# Patient Record
Sex: Male | Born: 1950 | Race: White | Hispanic: No | Marital: Married | Smoking: Never smoker
Health system: Southern US, Community
[De-identification: ages and names within clinical notes are randomized; demographics above are authoritative.]

## PROBLEM LIST (undated history)

## (undated) DIAGNOSIS — M1990 Unspecified osteoarthritis, unspecified site: Secondary | ICD-10-CM

## (undated) DIAGNOSIS — I10 Essential (primary) hypertension: Secondary | ICD-10-CM

## (undated) DIAGNOSIS — D649 Anemia, unspecified: Secondary | ICD-10-CM

## (undated) DIAGNOSIS — K219 Gastro-esophageal reflux disease without esophagitis: Secondary | ICD-10-CM

## (undated) HISTORY — DX: Anemia, unspecified: D64.9

## (undated) HISTORY — PX: OTHER SURGICAL HISTORY: SHX169

## (undated) HISTORY — DX: Gastro-esophageal reflux disease without esophagitis: K21.9

## (undated) HISTORY — PX: NO PAST SURGERIES: SHX2092

---

## 2011-12-09 ENCOUNTER — Other Ambulatory Visit: Payer: Self-pay | Admitting: Neurosurgery

## 2011-12-11 ENCOUNTER — Encounter (HOSPITAL_COMMUNITY): Payer: Self-pay | Admitting: Pharmacy Technician

## 2011-12-13 ENCOUNTER — Ambulatory Visit (HOSPITAL_COMMUNITY)
Admission: RE | Admit: 2011-12-13 | Discharge: 2011-12-13 | Disposition: A | Discharge status: Home / Self Care | Payer: Commercial Managed Care - PPO | Source: Ambulatory Visit | Attending: Neurosurgery | Admitting: Neurosurgery

## 2011-12-13 ENCOUNTER — Encounter (HOSPITAL_COMMUNITY)
Admission: RE | Admit: 2011-12-13 | Discharge: 2011-12-13 | Disposition: A | Discharge status: Home / Self Care | Payer: Commercial Managed Care - PPO | Source: Ambulatory Visit | Attending: Neurosurgery | Admitting: Neurosurgery

## 2011-12-13 ENCOUNTER — Encounter (HOSPITAL_COMMUNITY): Payer: Self-pay

## 2011-12-13 DIAGNOSIS — Z01810 Encounter for preprocedural cardiovascular examination: Secondary | ICD-10-CM | POA: Insufficient documentation

## 2011-12-13 DIAGNOSIS — Z01812 Encounter for preprocedural laboratory examination: Secondary | ICD-10-CM | POA: Insufficient documentation

## 2011-12-13 DIAGNOSIS — Z01818 Encounter for other preprocedural examination: Secondary | ICD-10-CM | POA: Insufficient documentation

## 2011-12-13 DIAGNOSIS — I10 Essential (primary) hypertension: Secondary | ICD-10-CM | POA: Insufficient documentation

## 2011-12-13 HISTORY — DX: Unspecified osteoarthritis, unspecified site: M19.90

## 2011-12-13 HISTORY — DX: Essential (primary) hypertension: I10

## 2011-12-13 LAB — SURGICAL PCR SCREEN
MRSA, PCR: NEGATIVE
Staphylococcus aureus: NEGATIVE

## 2011-12-13 LAB — BASIC METABOLIC PANEL
Chloride: 99 mEq/L (ref 96–112)
GFR calc Af Amer: >90 mL/min (ref >90)
GFR calc non Af Amer: 80 mL/min — ABNORMAL LOW (ref >90)
Potassium: 4.7 mEq/L (ref 3.5–5.1)
Sodium: 133 mEq/L — ABNORMAL LOW (ref 135–145)

## 2011-12-13 LAB — CBC
HCT: 38.4 % — ABNORMAL LOW (ref 39.0–52.0)
Hemoglobin: 13.5 g/dL (ref 13.0–17.0)
RBC: 4.5 MIL/uL (ref 4.22–5.81)
WBC: 11.7 10*3/uL — ABNORMAL HIGH (ref 4.0–10.5)

## 2011-12-17 MED ORDER — CEFAZOLIN SODIUM-DEXTROSE 2-3 GM-% IV SOLR
2.0000 g | INTRAVENOUS | Status: AC
  Administered 2011-12-18: 2 g via INTRAVENOUS

## 2011-12-18 ENCOUNTER — Inpatient Hospital Stay (HOSPITAL_COMMUNITY)
Admission: RE | Admit: 2011-12-18 | Discharge: 2011-12-19 | DRG: 473 | Disposition: A | Discharge status: Home / Self Care | Payer: Commercial Managed Care - PPO | Source: Ambulatory Visit | Attending: Neurosurgery | Admitting: Neurosurgery

## 2011-12-18 ENCOUNTER — Encounter (HOSPITAL_COMMUNITY): Payer: Self-pay | Admitting: Anesthesiology

## 2011-12-18 ENCOUNTER — Ambulatory Visit (HOSPITAL_COMMUNITY): Payer: Commercial Managed Care - PPO | Admitting: Anesthesiology

## 2011-12-18 ENCOUNTER — Ambulatory Visit (HOSPITAL_COMMUNITY): Payer: Commercial Managed Care - PPO

## 2011-12-18 ENCOUNTER — Encounter (HOSPITAL_COMMUNITY)
Admission: RE | Disposition: A | Discharge status: Home / Self Care | Payer: Self-pay | Source: Ambulatory Visit | Attending: Neurosurgery

## 2011-12-18 ENCOUNTER — Encounter (HOSPITAL_COMMUNITY): Payer: Self-pay | Admitting: *Deleted

## 2011-12-18 DIAGNOSIS — M5030 Other cervical disc degeneration, unspecified cervical region: Principal | ICD-10-CM | POA: Diagnosis present

## 2011-12-18 DIAGNOSIS — I10 Essential (primary) hypertension: Secondary | ICD-10-CM | POA: Diagnosis present

## 2011-12-18 DIAGNOSIS — M47812 Spondylosis without myelopathy or radiculopathy, cervical region: Secondary | ICD-10-CM | POA: Diagnosis present

## 2011-12-18 DIAGNOSIS — M4712 Other spondylosis with myelopathy, cervical region: Secondary | ICD-10-CM

## 2011-12-18 HISTORY — PX: ANTERIOR CERVICAL DECOMP/DISCECTOMY FUSION: SHX1161

## 2011-12-18 SURGERY — ANTERIOR CERVICAL DECOMPRESSION/DISCECTOMY FUSION 1 LEVEL
Anesthesia: General | Wound class: Clean

## 2011-12-18 MED ORDER — NEOSTIGMINE METHYLSULFATE 1 MG/ML IJ SOLN
INTRAMUSCULAR | Status: DC | PRN
  Administered 2011-12-18: 4 mg via INTRAVENOUS

## 2011-12-18 MED ORDER — MENTHOL 3 MG MT LOZG: 1.0000 | LOZENGE | OROMUCOSAL | Status: DC | PRN

## 2011-12-18 MED ORDER — OXYCODONE-ACETAMINOPHEN 5-325 MG PO TABS: 1.0000 | ORAL_TABLET | ORAL | Status: DC | PRN

## 2011-12-18 MED ORDER — ROCURONIUM BROMIDE 100 MG/10ML IV SOLN
INTRAVENOUS | Status: DC | PRN
  Administered 2011-12-18: 50 mg via INTRAVENOUS

## 2011-12-18 MED ORDER — GLYCOPYRROLATE 0.2 MG/ML IJ SOLN
INTRAMUSCULAR | Status: DC | PRN
  Administered 2011-12-18: .8 mg via INTRAVENOUS

## 2011-12-18 MED ORDER — HYDROMORPHONE HCL PF 1 MG/ML IJ SOLN: 0.2500 mg | INTRAMUSCULAR | Status: DC | PRN

## 2011-12-18 MED ORDER — FENTANYL CITRATE 0.05 MG/ML IJ SOLN
INTRAMUSCULAR | Status: DC | PRN
  Administered 2011-12-18: 100 ug via INTRAVENOUS
  Administered 2011-12-18 (×3): 50 ug via INTRAVENOUS

## 2011-12-18 MED ORDER — DEXAMETHASONE 4 MG PO TABS
4.0000 mg | ORAL_TABLET | Freq: Four times a day (QID) | ORAL | Status: AC
  Administered 2011-12-18: 4 mg via ORAL
  Filled 2011-12-18 (×3): qty 1

## 2011-12-18 MED ORDER — ACETAMINOPHEN 650 MG RE SUPP: 650.0000 mg | RECTAL | Status: DC | PRN

## 2011-12-18 MED ORDER — METOCLOPRAMIDE HCL 5 MG/ML IJ SOLN: 10.0000 mg | Freq: Once | INTRAMUSCULAR | Status: DC | PRN

## 2011-12-18 MED ORDER — OXYCODONE HCL 5 MG PO TABS: 5.0000 mg | ORAL_TABLET | Freq: Once | ORAL | Status: DC | PRN

## 2011-12-18 MED ORDER — ONDANSETRON HCL 4 MG/2ML IJ SOLN: 4.0000 mg | INTRAMUSCULAR | Status: DC | PRN

## 2011-12-18 MED ORDER — LACTATED RINGERS IV SOLN
INTRAVENOUS | Status: DC
  Administered 2011-12-18: 18:00:00 via INTRAVENOUS

## 2011-12-18 MED ORDER — DIAZEPAM 5 MG PO TABS
5.0000 mg | ORAL_TABLET | Freq: Four times a day (QID) | ORAL | Status: DC | PRN
  Administered 2011-12-18 – 2011-12-19 (×2): 5 mg via ORAL
  Filled 2011-12-18 (×2): qty 1

## 2011-12-18 MED ORDER — DEXAMETHASONE SODIUM PHOSPHATE 10 MG/ML IJ SOLN
INTRAMUSCULAR | Status: DC | PRN
  Administered 2011-12-18: 10 mg via INTRAVENOUS

## 2011-12-18 MED ORDER — HYDROCODONE-ACETAMINOPHEN 5-325 MG PO TABS
1.0000 | ORAL_TABLET | ORAL | Status: DC | PRN
  Administered 2011-12-18 – 2011-12-19 (×3): 2 via ORAL
  Filled 2011-12-18 (×3): qty 2

## 2011-12-18 MED ORDER — PROPOFOL 10 MG/ML IV BOLUS
INTRAVENOUS | Status: DC | PRN
  Administered 2011-12-18: 200 mg via INTRAVENOUS

## 2011-12-18 MED ORDER — ONDANSETRON HCL 4 MG/2ML IJ SOLN
INTRAMUSCULAR | Status: DC | PRN
  Administered 2011-12-18 (×2): 4 mg via INTRAVENOUS

## 2011-12-18 MED ORDER — THROMBIN 5000 UNITS EX SOLR
CUTANEOUS | Status: DC | PRN
  Administered 2011-12-18 (×2): 5000 [IU] via TOPICAL

## 2011-12-18 MED ORDER — ZOLPIDEM TARTRATE 5 MG PO TABS: 5.0000 mg | ORAL_TABLET | Freq: Every evening | ORAL | Status: DC | PRN

## 2011-12-18 MED ORDER — DEXAMETHASONE SODIUM PHOSPHATE 4 MG/ML IJ SOLN
4.0000 mg | Freq: Four times a day (QID) | INTRAMUSCULAR | Status: AC
  Administered 2011-12-19 (×2): 4 mg via INTRAVENOUS
  Filled 2011-12-18 (×2): qty 1

## 2011-12-18 MED ORDER — ARTIFICIAL TEARS OP OINT
TOPICAL_OINTMENT | OPHTHALMIC | Status: DC | PRN
  Administered 2011-12-18: 1 via OPHTHALMIC

## 2011-12-18 MED ORDER — MORPHINE SULFATE 2 MG/ML IJ SOLN: 1.0000 mg | INTRAMUSCULAR | Status: DC | PRN

## 2011-12-18 MED ORDER — EPHEDRINE SULFATE 50 MG/ML IJ SOLN
INTRAMUSCULAR | Status: DC | PRN
  Administered 2011-12-18: 5 mg via INTRAVENOUS
  Administered 2011-12-18 (×2): 10 mg via INTRAVENOUS

## 2011-12-18 MED ORDER — ENALAPRIL MALEATE 10 MG PO TABS
10.0000 mg | ORAL_TABLET | Freq: Every day | ORAL | Status: DC
  Administered 2011-12-18 – 2011-12-19 (×2): 10 mg via ORAL
  Filled 2011-12-18 (×2): qty 1

## 2011-12-18 MED ORDER — OXYCODONE HCL 5 MG/5ML PO SOLN: 5.0000 mg | Freq: Once | ORAL | Status: DC | PRN

## 2011-12-18 MED ORDER — LACTATED RINGERS IV SOLN
INTRAVENOUS | Status: DC | PRN
  Administered 2011-12-18 (×2): via INTRAVENOUS

## 2011-12-18 MED ORDER — 0.9 % SODIUM CHLORIDE (POUR BTL) OPTIME
TOPICAL | Status: DC | PRN
  Administered 2011-12-18: 1000 mL

## 2011-12-18 MED ORDER — DOCUSATE SODIUM 100 MG PO CAPS
100.0000 mg | ORAL_CAPSULE | Freq: Two times a day (BID) | ORAL | Status: DC
  Administered 2011-12-18 – 2011-12-19 (×2): 100 mg via ORAL
  Filled 2011-12-18: qty 1

## 2011-12-18 MED ORDER — SODIUM CHLORIDE 0.9 % IV SOLN
INTRAVENOUS | Status: AC
  Filled 2011-12-18: qty 500

## 2011-12-18 MED ORDER — HEMOSTATIC AGENTS (NO CHARGE) OPTIME
TOPICAL | Status: DC | PRN
  Administered 2011-12-18: 1 via TOPICAL

## 2011-12-18 MED ORDER — CEFAZOLIN SODIUM-DEXTROSE 2-3 GM-% IV SOLR
2.0000 g | Freq: Three times a day (TID) | INTRAVENOUS | Status: AC
  Administered 2011-12-18 – 2011-12-19 (×2): 2 g via INTRAVENOUS
  Filled 2011-12-18 (×2): qty 50

## 2011-12-18 MED ORDER — FAMOTIDINE 20 MG PO TABS
20.0000 mg | ORAL_TABLET | Freq: Two times a day (BID) | ORAL | Status: DC
  Administered 2011-12-18 – 2011-12-19 (×2): 20 mg via ORAL
  Filled 2011-12-18 (×4): qty 1

## 2011-12-18 MED ORDER — BUPIVACAINE-EPINEPHRINE PF 0.5-1:200000 % IJ SOLN
INTRAMUSCULAR | Status: DC | PRN
  Administered 2011-12-18: 10 mL

## 2011-12-18 MED ORDER — ACETAMINOPHEN 325 MG PO TABS: 650.0000 mg | ORAL_TABLET | ORAL | Status: DC | PRN

## 2011-12-18 MED ORDER — BACITRACIN ZINC 500 UNIT/GM EX OINT
TOPICAL_OINTMENT | CUTANEOUS | Status: DC | PRN
  Administered 2011-12-18: 1 via TOPICAL

## 2011-12-18 MED ORDER — PHENOL 1.4 % MT LIQD: 1.0000 | OROMUCOSAL | Status: DC | PRN

## 2011-12-18 MED ORDER — BACITRACIN 50000 UNITS IM SOLR
INTRAMUSCULAR | Status: AC
  Filled 2011-12-18: qty 1

## 2011-12-18 SURGICAL SUPPLY — 57 items
BAG DECANTER FOR FLEXI CONT (MISCELLANEOUS) ×2 IMPLANT
BENZOIN TINCTURE PRP APPL 2/3 (GAUZE/BANDAGES/DRESSINGS) ×2 IMPLANT
BIT DRILL SPINE QC 14 (BIT) ×2 IMPLANT
BLADE SURG 15 STRL LF DISP TIS (BLADE) ×1 IMPLANT
BLADE SURG 15 STRL SS (BLADE) ×1
BLADE ULTRA TIP 2M (BLADE) ×2 IMPLANT
BRUSH SCRUB EZ PLAIN DRY (MISCELLANEOUS) ×2 IMPLANT
BUR BARREL STRAIGHT FLUTE 4.0 (BURR) ×2 IMPLANT
BUR MATCHSTICK NEURO 3.0 LAGG (BURR) ×2 IMPLANT
CANISTER SUCTION 2500CC (MISCELLANEOUS) ×2 IMPLANT
CLOTH BEACON ORANGE TIMEOUT ST (SAFETY) ×2 IMPLANT
CONT SPEC 4OZ CLIKSEAL STRL BL (MISCELLANEOUS) ×2 IMPLANT
COVER MAYO STAND STRL (DRAPES) ×2 IMPLANT
DRAPE LAPAROTOMY 100X72 PEDS (DRAPES) ×2 IMPLANT
DRAPE MICROSCOPE LEICA (MISCELLANEOUS) IMPLANT
DRAPE POUCH INSTRU U-SHP 10X18 (DRAPES) ×2 IMPLANT
DRAPE SURG 17X23 STRL (DRAPES) ×4 IMPLANT
ELECT REM PT RETURN 9FT ADLT (ELECTROSURGICAL) ×2
ELECTRODE REM PT RTRN 9FT ADLT (ELECTROSURGICAL) ×1 IMPLANT
GAUZE SPONGE 4X4 16PLY XRAY LF (GAUZE/BANDAGES/DRESSINGS) IMPLANT
GLOVE BIO SURGEON STRL SZ8 (GLOVE) ×2 IMPLANT
GLOVE BIO SURGEON STRL SZ8.5 (GLOVE) ×2 IMPLANT
GLOVE BIOGEL PI IND STRL 8 (GLOVE) ×1 IMPLANT
GLOVE BIOGEL PI INDICATOR 8 (GLOVE) ×1
GLOVE ECLIPSE 7.5 STRL STRAW (GLOVE) ×6 IMPLANT
GLOVE EXAM NITRILE LRG STRL (GLOVE) IMPLANT
GLOVE EXAM NITRILE MD LF STRL (GLOVE) IMPLANT
GLOVE EXAM NITRILE XL STR (GLOVE) IMPLANT
GLOVE EXAM NITRILE XS STR PU (GLOVE) IMPLANT
GLOVE SS BIOGEL STRL SZ 8 (GLOVE) ×1 IMPLANT
GLOVE SUPERSENSE BIOGEL SZ 8 (GLOVE) ×1
GOWN BRE IMP SLV AUR LG STRL (GOWN DISPOSABLE) IMPLANT
GOWN BRE IMP SLV AUR XL STRL (GOWN DISPOSABLE) ×2 IMPLANT
KIT BASIN OR (CUSTOM PROCEDURE TRAY) ×2 IMPLANT
KIT ROOM TURNOVER OR (KITS) ×2 IMPLANT
MARKER SKIN DUAL TIP RULER LAB (MISCELLANEOUS) ×2 IMPLANT
NEEDLE HYPO 22GX1.5 SAFETY (NEEDLE) ×2 IMPLANT
NEEDLE SPNL 18GX3.5 QUINCKE PK (NEEDLE) ×2 IMPLANT
NS IRRIG 1000ML POUR BTL (IV SOLUTION) ×2 IMPLANT
PACK LAMINECTOMY NEURO (CUSTOM PROCEDURE TRAY) ×2 IMPLANT
PEEK VISTA 14X14X7MM (Peek) ×2 IMPLANT
PIN DISTRACTION 14MM (PIN) ×4 IMPLANT
PLATE ANT CERV XTEND 1 LV 14 (Plate) ×2 IMPLANT
PUTTY 2.5ML ACTIFUSE ABX (Putty) ×2 IMPLANT
RUBBERBAND STERILE (MISCELLANEOUS) IMPLANT
SCREW XTD VAR 4.2 SELF TAP (Screw) ×8 IMPLANT
SPONGE GAUZE 4X4 12PLY (GAUZE/BANDAGES/DRESSINGS) ×2 IMPLANT
SPONGE INTESTINAL PEANUT (DISPOSABLE) ×4 IMPLANT
SPONGE SURGIFOAM ABS GEL SZ50 (HEMOSTASIS) ×2 IMPLANT
STRIP CLOSURE SKIN 1/2X4 (GAUZE/BANDAGES/DRESSINGS) ×2 IMPLANT
SUT VIC AB 0 CT1 27 (SUTURE) ×1
SUT VIC AB 0 CT1 27XBRD ANTBC (SUTURE) ×1 IMPLANT
SUT VIC AB 3-0 SH 8-18 (SUTURE) ×2 IMPLANT
SYR 20ML ECCENTRIC (SYRINGE) ×2 IMPLANT
TOWEL OR 17X24 6PK STRL BLUE (TOWEL DISPOSABLE) ×2 IMPLANT
TOWEL OR 17X26 10 PK STRL BLUE (TOWEL DISPOSABLE) ×2 IMPLANT
WATER STERILE IRR 1000ML POUR (IV SOLUTION) ×2 IMPLANT

## 2011-12-18 NOTE — H&P (Signed)
Subjective: The patient is a 61 year old white male who is complaining of neck and right arm pain consistent with a cervical radiculopathy. He has failed medical management and was worked up with a cervical MRI. This has demonstrated the patient has spinal stenosis at C6-7. I discussed the various treatment options with the patient including surgery. The patient has weighed the risks, benefits, and alternatives surgery and decided proceed with a C6-7 anterior cervicectomy, fusion, and plating.   Past Medical History  Diagnosis Date  . Hypertension   . Arthritis     Past Surgical History  Procedure Date  . Colonscopy   . No past surgeries     No Known Allergies  History  Substance Use Topics  . Smoking status: Never Smoker   . Smokeless tobacco: Not on file  . Alcohol Use: No    History reviewed. No pertinent family history. Prior to Admission medications   Medication Sig Start Date End Date Taking? Authorizing Provider  enalapril (VASOTEC) 10 MG tablet Take 10 mg by mouth daily.   Yes Historical Provider, MD  famotidine (PEPCID) 20 MG tablet Take 20 mg by mouth at bedtime as needed. HEARTBURN   Yes Historical Provider, MD  HYDROcodone-acetaminophen (NORCO) 10-325 MG per tablet Take 1 tablet by mouth every 6 (six) hours as needed. PAIN   Yes Historical Provider, MD  traMADol (ULTRAM) 50 MG tablet Take 50 mg by mouth every 8 (eight) hours as needed. PAIN   Yes Historical Provider, MD     Review of Systems  Positive ROS: As above  All other systems have been reviewed and were otherwise negative with the exception of those mentioned in the HPI and as above.  Objective: Vital signs in last 24 hours: Temp:  [98.2 F (36.8 C)] 98.2 F (36.8 C) (10/23 0943) Pulse Rate:  [64] 64  (10/23 0943) Resp:  [18] 18  (10/23 0943) BP: (150)/(83) 150/83 mmHg (10/23 0943) SpO2:  [99 %] 99 % (10/23 0943)  General Appearance: Alert, cooperative, no distress, appears stated age Head:  Normocephalic, without obvious abnormality, atraumatic Eyes: PERRL, conjunctiva/corneas clear, EOM's intact, fundi benign, both eyes      Ears: Normal TM's and external ear canals, both ears Throat: Lips, mucosa, and tongue normal; teeth and gums normal Neck: Supple, symmetrical, trachea midline, no adenopathy; thyroid: No enlargement/tenderness/nodules; no carotid bruit or JVD Back: Symmetric, no curvature, ROM normal, no CVA tenderness Lungs: Clear to auscultation bilaterally, respirations unlabored Heart: Regular rate and rhythm, S1 and S2 normal, no murmur, rub or gallop Abdomen: Soft, non-tender, bowel sounds active all four quadrants, no masses, no organomegaly Extremities: Extremities normal, atraumatic, no cyanosis or edema Pulses: 2+ and symmetric all extremities Skin: Skin color, texture, turgor normal, no rashes or lesions  NEUROLOGIC:   Mental status: alert and oriented, no aphasia, good attention span, Fund of knowledge/ memory ok Motor Exam - grossly normal Sensory Exam - grossly normal Reflexes:  Coordination - grossly normal Gait - grossly normal Balance - grossly normal Cranial Nerves: I: smell Not tested  II: visual acuity  OS: Normal    OD: Normal   II: visual fields Full to confrontation  II: pupils Equal, round, reactive to light  III,VII: ptosis None  III,IV,VI: extraocular muscles  Full ROM  V: mastication Normal  V: facial light touch sensation  Normal  V,VII: corneal reflex  Present  VII: facial muscle function - upper  Normal  VII: facial muscle function - lower Normal  VIII: hearing Not  tested  IX: soft palate elevation  Normal  IX,X: gag reflex Present  XI: trapezius strength  5/5  XI: sternocleidomastoid strength 5/5  XI: neck flexion strength  5/5  XII: tongue strength  Normal    Data Review Lab Results  Component Value Date   WBC 11.7* 12/13/2011   HGB 13.5 12/13/2011   HCT 38.4* 12/13/2011   MCV 85.3 12/13/2011   PLT 243 12/13/2011    Lab Results  Component Value Date   NA 133* 12/13/2011   K 4.7 12/13/2011   CL 99 12/13/2011   CO2 25 12/13/2011   BUN 14 12/13/2011   CREATININE 1.00 12/13/2011   GLUCOSE 94 12/13/2011   No results found for this basename: INR, PROTIME    Assessment/Plan: 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 I reviewed his MR scan with them and pointed out the abnormalities. We have discussed the various treatment option including surgery. I described the surgical option of a C6-7 anterior cervicectomy, fusion, and plating. I described the surgery to them and I've shown him surgical models. I discussed the risks, benefits, alternatives and likelihood of achieving our goals with surgery. I've answered all the patient's questions. He has decided proceed with surgery.   Tressie Stalker D 12/18/2011 12:42 PM

## 2011-12-18 NOTE — Anesthesia Preprocedure Evaluation (Addendum)
Anesthesia Evaluation  Patient identified by MRN, date of birth, ID band Patient awake    Reviewed: Allergy & Precautions, H&P , NPO status , Patient's Chart, lab work & pertinent test results, reviewed documented beta blocker date and time   Airway Mallampati: II TM Distance: >3 FB Neck ROM: limited    Dental  (+) Missing, Partial Lower and Dental Advidsory Given   Pulmonary neg pulmonary ROS,  breath sounds clear to auscultation        Cardiovascular hypertension, On Medications Rhythm:regular     Neuro/Psych negative neurological ROS  negative psych ROS   GI/Hepatic negative GI ROS, Neg liver ROS,   Endo/Other  negative endocrine ROS  Renal/GU negative Renal ROS  negative genitourinary   Musculoskeletal   Abdominal   Peds  Hematology negative hematology ROS (+)   Anesthesia Other Findings See surgeon's H&P   Reproductive/Obstetrics negative OB ROS                          Anesthesia Physical Anesthesia Plan  ASA: II  Anesthesia Plan: General   Post-op Pain Management:    Induction: Intravenous  Airway Management Planned: Oral ETT  Additional Equipment:   Intra-op Plan:   Post-operative Plan: Extubation in OR  Informed Consent: I have reviewed the patients History and Physical, chart, labs and discussed the procedure including the risks, benefits and alternatives for the proposed anesthesia with the patient or authorized representative who has indicated his/her understanding and acceptance.   Dental Advisory Given  Plan Discussed with: CRNA and Surgeon  Anesthesia Plan Comments:         Anesthesia Quick Evaluation

## 2011-12-18 NOTE — Anesthesia Postprocedure Evaluation (Signed)
  Anesthesia Post-op Note  Patient: JAELEN SOTH  Procedure(s) Performed: Procedure(s) (LRB) with comments: ANTERIOR CERVICAL DECOMPRESSION/DISCECTOMY FUSION 1 LEVEL (N/A) - Cervical six-seven anterior cervical decompression and fusion with interbody prothesis plating and bonegraft  Patient Location: PACU  Anesthesia Type: General  Level of Consciousness: awake, oriented, sedated and patient cooperative  Airway and Oxygen Therapy: Patient Spontanous Breathing  Post-op Pain: mild  Post-op Assessment: Post-op Vital signs reviewed, Patient's Cardiovascular Status Stable, Respiratory Function Stable, Patent Airway, No signs of Nausea or vomiting and Pain level controlled  Post-op Vital Signs: stable  Complications: No apparent anesthesia complications

## 2011-12-18 NOTE — Transfer of Care (Signed)
Immediate Anesthesia Transfer of Care Note  Patient: Keith Hester  Procedure(s) Performed: Procedure(s) (LRB) with comments: ANTERIOR CERVICAL DECOMPRESSION/DISCECTOMY FUSION 1 LEVEL (N/A) - Cervical six-seven anterior cervical decompression and fusion with interbody prothesis plating and bonegraft  Patient Location: PACU  Anesthesia Type: General  Level of Consciousness: awake, alert , oriented and patient cooperative  Airway & Oxygen Therapy: Patient Spontanous Breathing and Patient connected to nasal cannula oxygen  Post-op Assessment: Report given to PACU RN, Post -op Vital signs reviewed and stable and Patient moving all extremities X 4  Post vital signs: Reviewed and stable  Complications: No apparent anesthesia complications

## 2011-12-18 NOTE — Anesthesia Procedure Notes (Signed)
Procedure Name: Intubation Date/Time: 12/18/2011 1:11 PM Performed by: Donette Larry E Pre-anesthesia Checklist: Patient identified, Timeout performed, Emergency Drugs available, Suction available and Patient being monitored Patient Re-evaluated:Patient Re-evaluated prior to inductionOxygen Delivery Method: Circle system utilized Preoxygenation: Pre-oxygenation with 100% oxygen Intubation Type: IV induction Laryngoscope Size: Miller and 3 Grade View: Grade II Tube type: Oral Number of attempts: 1 Airway Equipment and Method: Stylet Placement Confirmation: ETT inserted through vocal cords under direct vision,  positive ETCO2,  CO2 detector and breath sounds checked- equal and bilateral Secured at: 23 cm Tube secured with: Tape Dental Injury: Teeth and Oropharynx as per pre-operative assessment

## 2011-12-18 NOTE — Preoperative (Signed)
Beta Blockers   Reason not to administer Beta Blockers:Not Applicable 

## 2011-12-18 NOTE — Op Note (Signed)
Brief history: The patient is a 61 year old white male who has complained of neck and right arm pain consistent with a cervical radiculopathy. He has failed medical management and was worked up with a cervical MRI. This study demonstrated spondylosis and stenosis at C6-7 on the right. I discussed the various treatment option with the patient including surgery. The patient has weighed the risks, benefits, and alternatives surgery decided proceed with a C6-7 anterior cervicectomy, fusion, and plating.  Preoperative diagnosis: C6-7 disc degeneration, spondylosis, stenosis, cervical radiculopathy/myelopathy, cervicalgia  Postoperative diagnosis: The same  Procedure: C6-7 Anterior cervical discectomy/decompression; C6-7 interbody arthrodesis with local morcellized autograft bone and Actifuse bone graft extender; insertion of interbody prosthesis at C6-7 (Zimmer peek interbody prosthesis); anterior cervical plating from C6-7 with globus titanium plate  Surgeon: Dr. Delma Officer  Asst.: Dr. Marikay Alar  Anesthesia: Gen. endotracheal  Estimated blood loss: 50 cc  Drains: None  Complications: None  Description of procedure: The patient was brought to the operating room by the anesthesia team. General endotracheal anesthesia was induced. A roll was placed under the patient's shoulders to keep the neck in the neutral position. The patient's anterior cervical region was then prepared with Betadine scrub and Betadine solution. Sterile drapes were applied.  The area to be incised was then injected with Marcaine with epinephrine solution. I then used a scalpel to make a transverse incision in the patient's left anterior neck. I used the Metzenbaum scissors to divide the platysmal muscle and then to dissect medial to the sternocleidomastoid muscle, jugular vein, and carotid artery. I carefully dissected down towards the anterior cervical spine identifying the esophagus and retracting it medially. Then using  Kitner swabs to clear soft tissue from the anterior cervical spine. We then inserted a bent spinal needle into the upper exposed intervertebral disc space. We then obtained intraoperative radiographs confirm our location.  I then used electrocautery to detach the medial border of the longus colli muscle bilaterally from the C6-7 intervertebral disc spaces. I then inserted the Caspar self-retaining retractor underneath the longus colli muscle bilaterally to provide exposure.  We then incised the intervertebral disc at C6-7. We then performed a partial intervertebral discectomy with a pituitary forceps and the Karlin curettes. I then inserted distraction screws into the vertebral bodies at C6 and C7. We then distracted the interspace. We then used the high-speed drill to decorticate the vertebral endplates at C6-7, to drill away the remainder of the intervertebral disc, to drill away some posterior spondylosis, and to thin out the posterior longitudinal ligament. I then incised ligament with the arachnoid knife. We then removed the ligament with a Kerrison punches undercutting the vertebral endplates and decompressing the thecal sac. We then performed foraminotomies about the bilateral C7 nerve roots. This completed the decompression at this level.   We now turned our to attention to the interbody fusion. We used the trial spacers to determine the appropriate size for the interbody prosthesis. We then pre-filled prosthesis with a combination of local morcellized autograft bone that we obtained during decompression as well as Actifuse bone graft extender. We then inserted the prosthesis into the distracted interspace at C6-7. We then removed the distraction screws. There was a good snug fit of the prosthesis in the interspace.   Having completed the fusion we now turned attention to the anterior spinal instrumentation. We used the high-speed drill to drill away some anterior spondylosis at the disc spaces so  that the plate lay down flat. We selected the appropriate length  titanium anterior cervical plate. We laid it along the anterior aspect of the vertebral bodies from C6-C7. We then drilled 14 mm holes at C6 and C7. We then secured the plate to the vertebral bodies by placing two 14 mm self-tapping screws at C6 and C7. We then obtained intraoperative radiograph. The x-ray was limited and mobile with the C-arm x-ray and in vivo looked good. We therefore secured the screws the plate the locking each cam. This completed the instrumentation.  We then obtained hemostasis using bipolar electrocautery. We irrigated the wound out with bacitracin solution. We then removed the retractor. We inspected the esophagus for any damage. There was none apparent. We then reapproximated patient's platysmal muscle with interrupted 3-0 Vicryl suture. We then reapproximated the subcutaneous tissue with interrupted 3-0 Vicryl suture. The skin was reapproximated with Steri-Strips and benzoin. The wound was then covered with bacitracin ointment. A sterile dressing was applied. The drapes were removed. Patient was subsequently extubated by the anesthesia team and transported to the post anesthesia care unit in stable condition. All sponge instrument and needle counts were reportedly correct at the end of this case.

## 2011-12-18 NOTE — Progress Notes (Signed)
Patient ID: HELIO LACK, male   DOB: [birth year], 61 y.o.   MRN: [id number] Subjective:  The patient is alert and pleasant. He is in no apparent distress. He looks well.  Objective: Vital signs in last 24 hours: Temp:  [98.2 F (36.8 C)] 98.2 F (36.8 C) (10/23 0943) Pulse Rate:  [64] 64  (10/23 0943) Resp:  [18] 18  (10/23 0943) BP: (150)/(83) 150/83 mmHg (10/23 0943) SpO2:  [99 %] 99 % (10/23 0943)  Intake/Output from previous day:   Intake/Output this shift: Total I/O In: 1000 [I.V.:1000] Out: -   Physical exam the patient is alert and pleasant. He is moving all 4 extremities well. His dressing is clean and dry. There is no evidence of hematoma or shift.  Lab Results: No results found for this basename: WBC:2,HGB:2,HCT:2,PLT:2 in the last 72 hours BMET No results found for this basename: NA:2,K:2,CL:2,CO2:2,GLUCOSE:2,BUN:2,CREATININE:2,CALCIUM:2 in the last 72 hours  Studies/Results: Dg Cervical Spine 2-3 Views  12/18/2011  *RADIOLOGY REPORT*  Clinical Data: C6-7 ACDF  CERVICAL SPINE - 2-3 VIEW  Comparison: None.  Findings: Initial intraoperative radiograph demonstrates a surgical probe at C4-5.  Second intraoperative radiograph demonstrates C6-7 cervical fusion hardware, incompletely visualized.  Multiple sponges are present in the surgical field.  IMPRESSION: Cervical levels as above.   Original Report Authenticated By: Charline Bills, M.D.     Assessment/Plan: The patient is doing well.  LOS: 0 days     Zyara Riling D 12/18/2011, 3:22 PM

## 2011-12-19 ENCOUNTER — Encounter (HOSPITAL_COMMUNITY): Payer: Self-pay | Admitting: Neurosurgery

## 2011-12-19 MED ORDER — DSS 100 MG PO CAPS: 100.0000 mg | ORAL_CAPSULE | Freq: Two times a day (BID) | ORAL | Status: AC

## 2011-12-19 MED ORDER — DIAZEPAM 5 MG PO TABS: 5.0000 mg | ORAL_TABLET | Freq: Four times a day (QID) | ORAL | Status: DC | PRN

## 2011-12-19 MED ORDER — OXYCODONE-ACETAMINOPHEN 5-325 MG PO TABS: 1.0000 | ORAL_TABLET | ORAL | Status: DC | PRN

## 2011-12-19 NOTE — Care Management Note (Unsigned)
    Page 1 of 1   12/19/2011     11:35:16 AM   CARE MANAGEMENT NOTE 12/19/2011  Patient:  Keith Hester, Keith Hester   Account Number:  [id number]  Date Initiated:  12/19/2011  Documentation initiated by:  [facility]  Subjective/Objective Assessment:   Admitted postop anterior cervical discectomy C6-7.     Action/Plan:   return home   Anticipated DC Date:  12/20/2011   Anticipated DC Plan:  HOME/SELF CARE      DC Planning Services  CM consult      Choice offered to / List presented to:             Status of service:  In process, will continue to follow Medicare Important Message given?   (If response is "NO", the following Medicare IM given date fields will be blank) Date Medicare IM given:   Date Additional Medicare IM given:    Discharge Disposition:    Per UR Regulation:  Reviewed for med. necessity/level of care/duration of stay  If discussed at Long Length of Stay Meetings, dates discussed:    Comments:  12/19/11 Spoke with patient and his wife about d/c plans. Patient reports able to ambulate around entire unit this am, no equipment needs identified. Stated had headache but now feeling well. Wife able to assist at home prn. No d/c needs identified. Will continue to follow. Jacquelynn Cree RN, BSN, CCM

## 2011-12-19 NOTE — Plan of Care (Signed)
Problem: Consults Goal: Diagnosis - Spinal Surgery Cervical Spine Fusion     

## 2011-12-19 NOTE — Progress Notes (Signed)
Pt doing very well. Pt and wife given D/C instructions with verbal understanding. Pt D/C'd home via wheelchair @ 1410 per MD order. Rema Fendt, RN

## 2011-12-19 NOTE — Discharge Summary (Signed)
  Physician Discharge Summary  Patient ID: Keith Hester MRN: [id number] DOB/AGE: 1950/07/02 61 y.o.  Admit date: 12/18/2011 Discharge date: 12/19/2011  Admission Diagnoses: C6-7 disc degeneration, spondylosis, stenosis, cervical radiculopathy, cervicalgia  Discharge Diagnoses: The same Active Problems:  * No active hospital problems. *    Discharged Condition: good  Hospital Course: I admitted the patient to [facility] on 12/18/2011. On that day I performed a C6-7 anterior cervical discectomy, fusion, and plating. The surgery went well.  The patient's postop course is unremarkable and he was compressing discharge to home on postop day #1. The patient was given oral and written discharge instructions. All his questions were answered.  Consults: None Significant Diagnostic Studies: None Treatments: C6-7 anterior cervicectomy, fusion, and plating. Discharge Exam: Blood pressure 125/72, pulse 65, temperature 97.6 F (36.4 C), temperature source Oral, resp. rate 18, height 6\' 1"  (1.854 m), weight 79.334 kg (174 lb 14.4 oz), SpO2 99.00%. The patient is alert and oriented. His strength is normal. His dressing is clean and dry. There is no evidence of hematoma or shift.  Disposition: Home  Discharge Orders    Future Orders Please Complete By Expires   Diet - low sodium heart healthy      Increase activity slowly      Discharge instructions      Comments:   Call [phone] for followup appointment.   Remove dressing in 48 hours      Call MD for:  temperature >100.4      Call MD for:  persistant nausea and vomiting      Call MD for:  severe uncontrolled pain      Call MD for:  redness, tenderness, or signs of infection (pain, swelling, redness, odor or green/yellow discharge around incision site)      Call MD for:  difficulty breathing, headache or visual disturbances      Call MD for:  hives      Call MD for:  persistant dizziness or light-headedness      Call MD  for:  extreme fatigue          Medication List     As of 12/19/2011  1:35 PM    STOP taking these medications         HYDROcodone-acetaminophen 10-325 MG per tablet   Commonly known as: NORCO      traMADol 50 MG tablet   Commonly known as: ULTRAM      TAKE these medications         diazepam 5 MG tablet   Commonly known as: VALIUM   Take 1 tablet (5 mg total) by mouth every 6 (six) hours as needed.      DSS 100 MG Caps   Take 100 mg by mouth 2 (two) times daily.      enalapril 10 MG tablet   Commonly known as: VASOTEC   Take 10 mg by mouth daily.      famotidine 20 MG tablet   Commonly known as: PEPCID   Take 20 mg by mouth at bedtime as needed. HEARTBURN      oxyCODONE-acetaminophen 5-325 MG per tablet   Commonly known as: PERCOCET/ROXICET   Take 1-2 tablets by mouth every 4 (four) hours as needed.         SignedTressie Stalker D 12/19/2011, 1:35 PM

## 2013-10-20 IMAGING — CR DG CERVICAL SPINE 2 OR 3 VIEWS
2 series · 2 of 2 positions shown · non-contrast
Comparison: None.

CLINICAL DATA: C6-7 ACDF

CERVICAL SPINE - 2-3 VIEW

[view not recorded (1 of 2)]
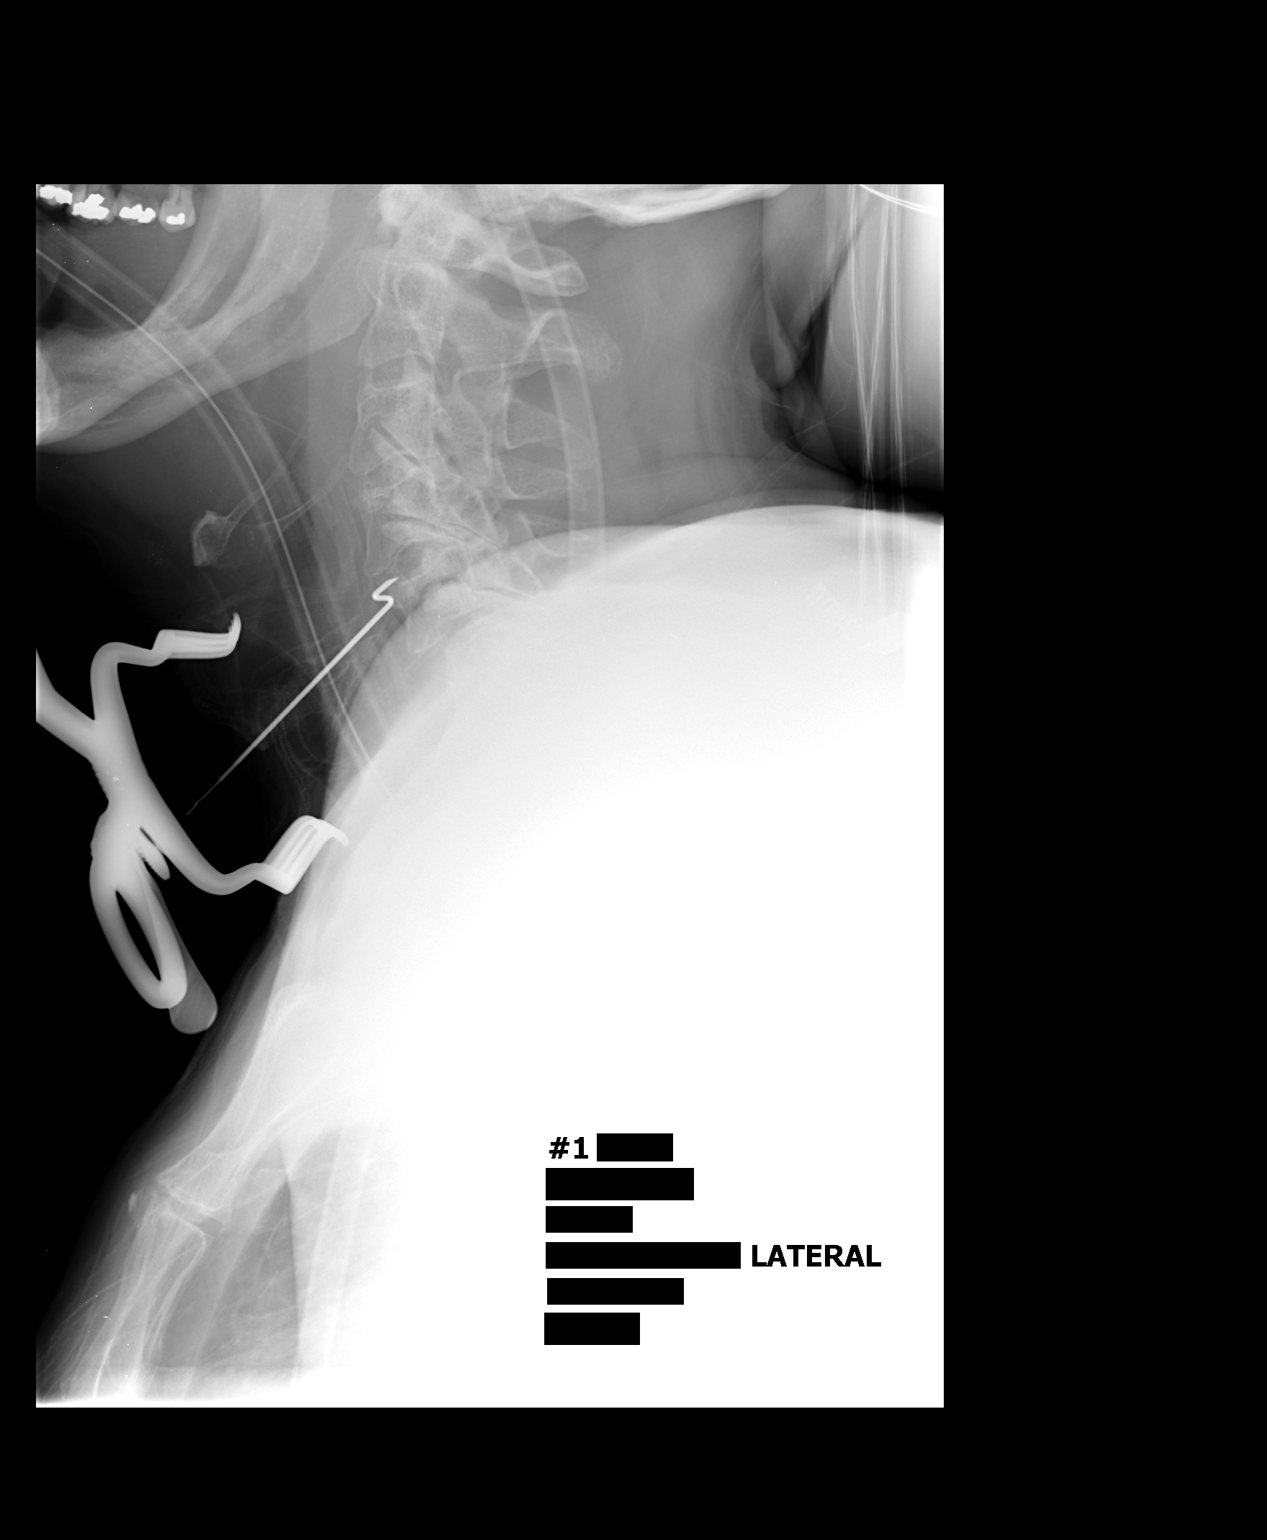

[view not recorded (2 of 2)]
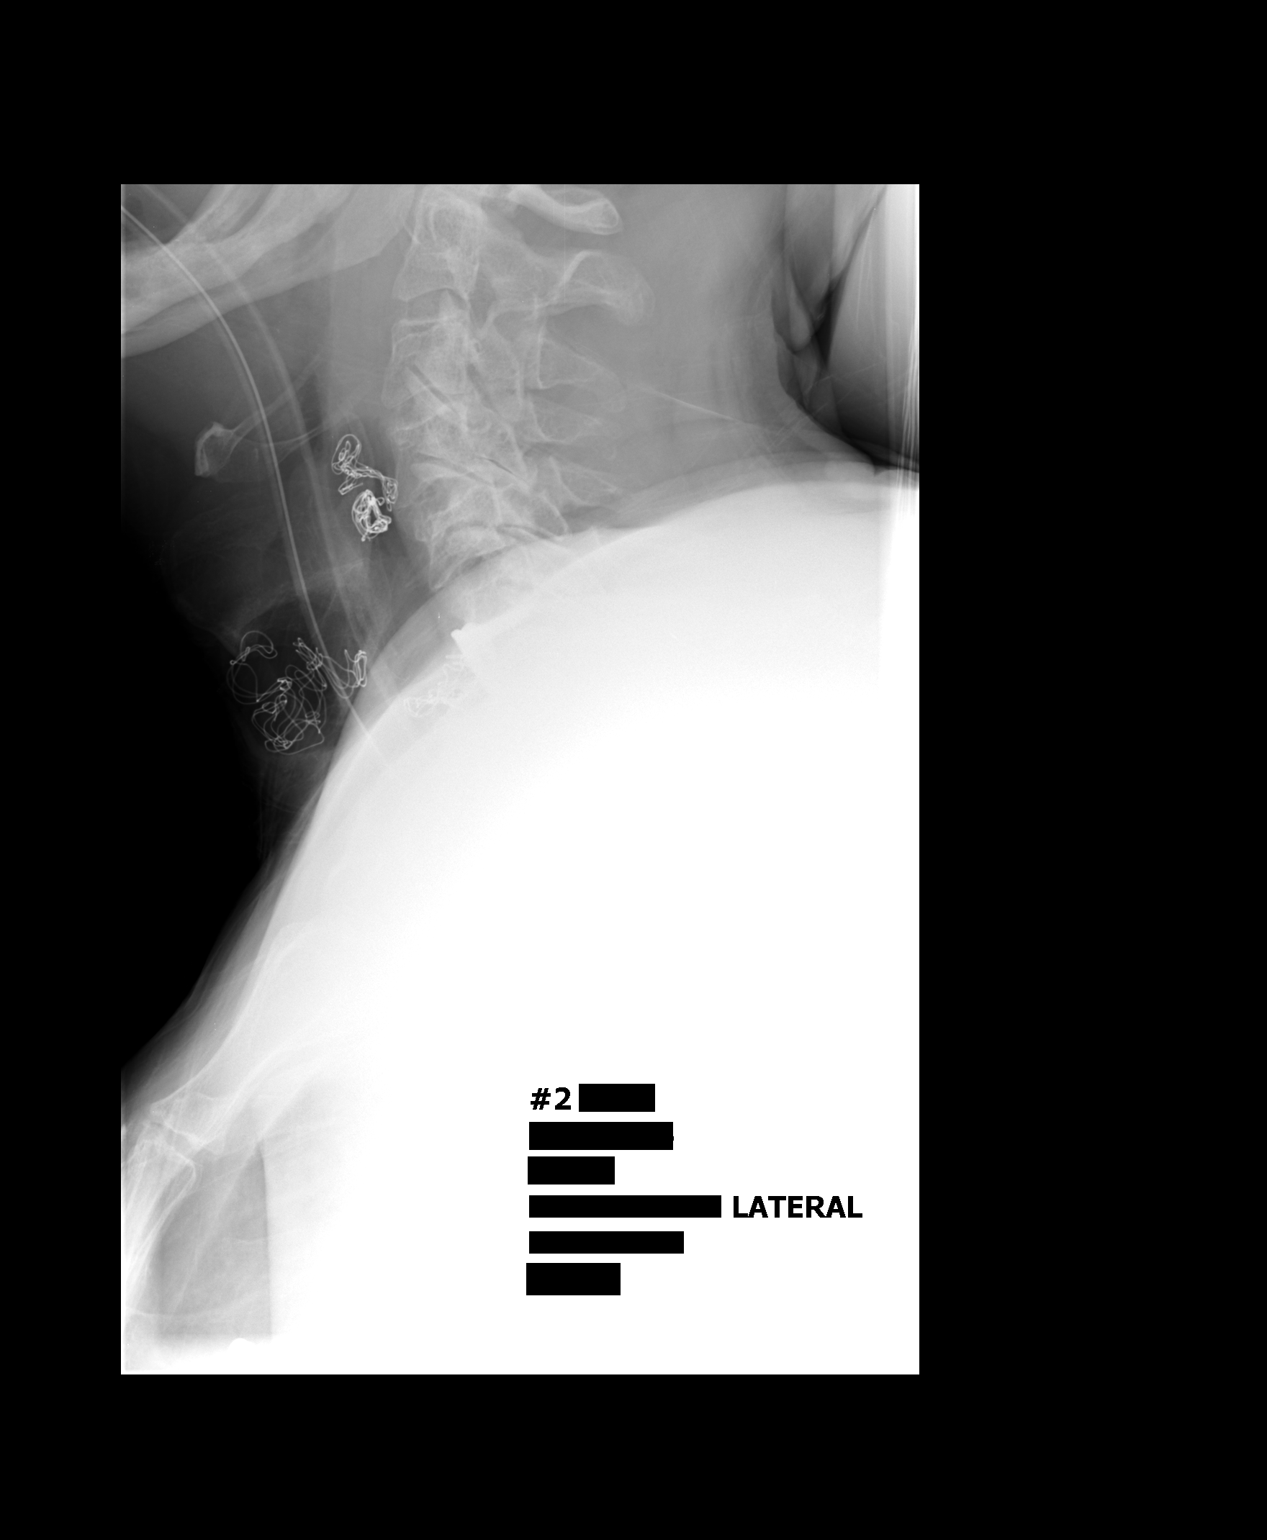

[2 of 2 positions shown; findings below may reference images not displayed]

FINDINGS: Initial intraoperative radiograph demonstrates a surgical
probe at C4-5.

Second intraoperative radiograph demonstrates C6-7 cervical fusion
hardware, incompletely visualized.

Multiple sponges are present in the surgical field.
IMPRESSION: Cervical levels as above.

## 2016-12-02 DIAGNOSIS — M25551 Pain in right hip: Secondary | ICD-10-CM | POA: Diagnosis not present

## 2016-12-02 DIAGNOSIS — M79604 Pain in right leg: Secondary | ICD-10-CM | POA: Diagnosis not present

## 2016-12-11 DIAGNOSIS — Z23 Encounter for immunization: Secondary | ICD-10-CM | POA: Diagnosis not present

## 2016-12-11 DIAGNOSIS — M5431 Sciatica, right side: Secondary | ICD-10-CM | POA: Diagnosis not present

## 2016-12-11 DIAGNOSIS — Z6825 Body mass index (BMI) 25.0-25.9, adult: Secondary | ICD-10-CM | POA: Diagnosis not present

## 2016-12-11 DIAGNOSIS — Z1339 Encounter for screening examination for other mental health and behavioral disorders: Secondary | ICD-10-CM | POA: Diagnosis not present

## 2016-12-11 DIAGNOSIS — Z9181 History of falling: Secondary | ICD-10-CM | POA: Diagnosis not present

## 2016-12-18 DIAGNOSIS — R2689 Other abnormalities of gait and mobility: Secondary | ICD-10-CM | POA: Diagnosis not present

## 2016-12-18 DIAGNOSIS — M6281 Muscle weakness (generalized): Secondary | ICD-10-CM | POA: Diagnosis not present

## 2016-12-18 DIAGNOSIS — M25551 Pain in right hip: Secondary | ICD-10-CM | POA: Diagnosis not present

## 2016-12-18 DIAGNOSIS — M545 Low back pain: Secondary | ICD-10-CM | POA: Diagnosis not present

## 2016-12-23 DIAGNOSIS — M6281 Muscle weakness (generalized): Secondary | ICD-10-CM | POA: Diagnosis not present

## 2016-12-23 DIAGNOSIS — R2689 Other abnormalities of gait and mobility: Secondary | ICD-10-CM | POA: Diagnosis not present

## 2016-12-23 DIAGNOSIS — M25551 Pain in right hip: Secondary | ICD-10-CM | POA: Diagnosis not present

## 2016-12-23 DIAGNOSIS — M545 Low back pain: Secondary | ICD-10-CM | POA: Diagnosis not present

## 2016-12-25 DIAGNOSIS — M6281 Muscle weakness (generalized): Secondary | ICD-10-CM | POA: Diagnosis not present

## 2016-12-25 DIAGNOSIS — R2689 Other abnormalities of gait and mobility: Secondary | ICD-10-CM | POA: Diagnosis not present

## 2016-12-25 DIAGNOSIS — M545 Low back pain: Secondary | ICD-10-CM | POA: Diagnosis not present

## 2016-12-25 DIAGNOSIS — M25551 Pain in right hip: Secondary | ICD-10-CM | POA: Diagnosis not present

## 2016-12-30 DIAGNOSIS — R2689 Other abnormalities of gait and mobility: Secondary | ICD-10-CM | POA: Diagnosis not present

## 2016-12-30 DIAGNOSIS — M6281 Muscle weakness (generalized): Secondary | ICD-10-CM | POA: Diagnosis not present

## 2016-12-30 DIAGNOSIS — M25551 Pain in right hip: Secondary | ICD-10-CM | POA: Diagnosis not present

## 2016-12-30 DIAGNOSIS — M545 Low back pain: Secondary | ICD-10-CM | POA: Diagnosis not present

## 2017-01-28 DIAGNOSIS — J209 Acute bronchitis, unspecified: Secondary | ICD-10-CM | POA: Diagnosis not present

## 2017-01-28 DIAGNOSIS — Z6825 Body mass index (BMI) 25.0-25.9, adult: Secondary | ICD-10-CM | POA: Diagnosis not present

## 2017-03-19 DIAGNOSIS — K219 Gastro-esophageal reflux disease without esophagitis: Secondary | ICD-10-CM | POA: Diagnosis not present

## 2017-03-19 DIAGNOSIS — I10 Essential (primary) hypertension: Secondary | ICD-10-CM | POA: Diagnosis not present

## 2017-03-19 DIAGNOSIS — J302 Other seasonal allergic rhinitis: Secondary | ICD-10-CM | POA: Diagnosis not present

## 2017-03-19 DIAGNOSIS — D510 Vitamin B12 deficiency anemia due to intrinsic factor deficiency: Secondary | ICD-10-CM | POA: Diagnosis not present

## 2017-03-19 DIAGNOSIS — Z1331 Encounter for screening for depression: Secondary | ICD-10-CM | POA: Diagnosis not present

## 2017-03-19 DIAGNOSIS — M1990 Unspecified osteoarthritis, unspecified site: Secondary | ICD-10-CM | POA: Diagnosis not present

## 2017-03-19 DIAGNOSIS — Z79899 Other long term (current) drug therapy: Secondary | ICD-10-CM | POA: Diagnosis not present

## 2017-03-19 DIAGNOSIS — Z6825 Body mass index (BMI) 25.0-25.9, adult: Secondary | ICD-10-CM | POA: Diagnosis not present

## 2017-09-28 DIAGNOSIS — S61012A Laceration without foreign body of left thumb without damage to nail, initial encounter: Secondary | ICD-10-CM | POA: Diagnosis not present

## 2017-11-24 DIAGNOSIS — H1033 Unspecified acute conjunctivitis, bilateral: Secondary | ICD-10-CM | POA: Diagnosis not present

## 2017-12-23 DIAGNOSIS — Z23 Encounter for immunization: Secondary | ICD-10-CM | POA: Diagnosis not present

## 2018-03-25 DIAGNOSIS — Z23 Encounter for immunization: Secondary | ICD-10-CM | POA: Diagnosis not present

## 2018-03-25 DIAGNOSIS — Z79899 Other long term (current) drug therapy: Secondary | ICD-10-CM | POA: Diagnosis not present

## 2018-03-25 DIAGNOSIS — K219 Gastro-esophageal reflux disease without esophagitis: Secondary | ICD-10-CM | POA: Diagnosis not present

## 2018-03-25 DIAGNOSIS — J302 Other seasonal allergic rhinitis: Secondary | ICD-10-CM | POA: Diagnosis not present

## 2018-03-25 DIAGNOSIS — I10 Essential (primary) hypertension: Secondary | ICD-10-CM | POA: Diagnosis not present

## 2018-03-25 DIAGNOSIS — Z9181 History of falling: Secondary | ICD-10-CM | POA: Diagnosis not present

## 2018-03-25 DIAGNOSIS — Z6826 Body mass index (BMI) 26.0-26.9, adult: Secondary | ICD-10-CM | POA: Diagnosis not present

## 2018-03-25 DIAGNOSIS — M1990 Unspecified osteoarthritis, unspecified site: Secondary | ICD-10-CM | POA: Diagnosis not present

## 2018-03-25 DIAGNOSIS — Z1331 Encounter for screening for depression: Secondary | ICD-10-CM | POA: Diagnosis not present

## 2018-03-25 DIAGNOSIS — Z0000 Encounter for general adult medical examination without abnormal findings: Secondary | ICD-10-CM | POA: Diagnosis not present

## 2018-03-25 DIAGNOSIS — D510 Vitamin B12 deficiency anemia due to intrinsic factor deficiency: Secondary | ICD-10-CM | POA: Diagnosis not present

## 2018-03-25 DIAGNOSIS — Z125 Encounter for screening for malignant neoplasm of prostate: Secondary | ICD-10-CM | POA: Diagnosis not present

## 2019-03-17 DIAGNOSIS — Z6824 Body mass index (BMI) 24.0-24.9, adult: Secondary | ICD-10-CM | POA: Diagnosis not present

## 2019-03-17 DIAGNOSIS — R361 Hematospermia: Secondary | ICD-10-CM | POA: Diagnosis not present

## 2019-03-17 DIAGNOSIS — M5430 Sciatica, unspecified side: Secondary | ICD-10-CM | POA: Diagnosis not present

## 2019-03-17 DIAGNOSIS — M545 Low back pain: Secondary | ICD-10-CM | POA: Diagnosis not present

## 2019-03-23 DIAGNOSIS — M1610 Unilateral primary osteoarthritis, unspecified hip: Secondary | ICD-10-CM | POA: Diagnosis not present

## 2019-05-04 DIAGNOSIS — K219 Gastro-esophageal reflux disease without esophagitis: Secondary | ICD-10-CM | POA: Diagnosis not present

## 2019-05-04 DIAGNOSIS — Z0000 Encounter for general adult medical examination without abnormal findings: Secondary | ICD-10-CM | POA: Diagnosis not present

## 2019-05-04 DIAGNOSIS — Z6825 Body mass index (BMI) 25.0-25.9, adult: Secondary | ICD-10-CM | POA: Diagnosis not present

## 2019-05-04 DIAGNOSIS — Z1331 Encounter for screening for depression: Secondary | ICD-10-CM | POA: Diagnosis not present

## 2019-05-04 DIAGNOSIS — Z9181 History of falling: Secondary | ICD-10-CM | POA: Diagnosis not present

## 2019-05-04 DIAGNOSIS — I10 Essential (primary) hypertension: Secondary | ICD-10-CM | POA: Diagnosis not present

## 2019-05-04 DIAGNOSIS — Z79899 Other long term (current) drug therapy: Secondary | ICD-10-CM | POA: Diagnosis not present

## 2019-05-04 DIAGNOSIS — Z13220 Encounter for screening for lipoid disorders: Secondary | ICD-10-CM | POA: Diagnosis not present

## 2019-05-04 DIAGNOSIS — Z1211 Encounter for screening for malignant neoplasm of colon: Secondary | ICD-10-CM | POA: Diagnosis not present

## 2019-05-04 DIAGNOSIS — M1990 Unspecified osteoarthritis, unspecified site: Secondary | ICD-10-CM | POA: Diagnosis not present

## 2019-05-04 DIAGNOSIS — D510 Vitamin B12 deficiency anemia due to intrinsic factor deficiency: Secondary | ICD-10-CM | POA: Diagnosis not present

## 2019-05-17 DIAGNOSIS — K219 Gastro-esophageal reflux disease without esophagitis: Secondary | ICD-10-CM | POA: Diagnosis not present

## 2019-05-17 DIAGNOSIS — Z01818 Encounter for other preprocedural examination: Secondary | ICD-10-CM | POA: Diagnosis not present

## 2019-06-03 DIAGNOSIS — L300 Nummular dermatitis: Secondary | ICD-10-CM | POA: Diagnosis not present

## 2019-06-03 DIAGNOSIS — D485 Neoplasm of uncertain behavior of skin: Secondary | ICD-10-CM | POA: Diagnosis not present

## 2019-10-18 DIAGNOSIS — M791 Myalgia, unspecified site: Secondary | ICD-10-CM | POA: Diagnosis not present

## 2019-10-18 DIAGNOSIS — R5383 Other fatigue: Secondary | ICD-10-CM | POA: Diagnosis not present

## 2019-10-18 DIAGNOSIS — Z20828 Contact with and (suspected) exposure to other viral communicable diseases: Secondary | ICD-10-CM | POA: Diagnosis not present

## 2020-05-18 DIAGNOSIS — M5431 Sciatica, right side: Secondary | ICD-10-CM | POA: Diagnosis not present

## 2020-05-18 DIAGNOSIS — I10 Essential (primary) hypertension: Secondary | ICD-10-CM | POA: Diagnosis not present

## 2020-05-18 DIAGNOSIS — Z9181 History of falling: Secondary | ICD-10-CM | POA: Diagnosis not present

## 2020-05-18 DIAGNOSIS — Z1331 Encounter for screening for depression: Secondary | ICD-10-CM | POA: Diagnosis not present

## 2020-05-18 DIAGNOSIS — Z6825 Body mass index (BMI) 25.0-25.9, adult: Secondary | ICD-10-CM | POA: Diagnosis not present

## 2020-05-18 DIAGNOSIS — Z0000 Encounter for general adult medical examination without abnormal findings: Secondary | ICD-10-CM | POA: Diagnosis not present

## 2020-05-23 DIAGNOSIS — R2689 Other abnormalities of gait and mobility: Secondary | ICD-10-CM | POA: Diagnosis not present

## 2020-05-23 DIAGNOSIS — M5431 Sciatica, right side: Secondary | ICD-10-CM | POA: Diagnosis not present

## 2020-05-23 DIAGNOSIS — M62551 Muscle wasting and atrophy, not elsewhere classified, right thigh: Secondary | ICD-10-CM | POA: Diagnosis not present

## 2020-05-25 DIAGNOSIS — M5431 Sciatica, right side: Secondary | ICD-10-CM | POA: Diagnosis not present

## 2020-05-25 DIAGNOSIS — R2689 Other abnormalities of gait and mobility: Secondary | ICD-10-CM | POA: Diagnosis not present

## 2020-05-25 DIAGNOSIS — M62551 Muscle wasting and atrophy, not elsewhere classified, right thigh: Secondary | ICD-10-CM | POA: Diagnosis not present

## 2020-06-22 DIAGNOSIS — G8929 Other chronic pain: Secondary | ICD-10-CM | POA: Diagnosis not present

## 2020-06-22 DIAGNOSIS — R29898 Other symptoms and signs involving the musculoskeletal system: Secondary | ICD-10-CM | POA: Diagnosis not present

## 2020-06-22 DIAGNOSIS — M5136 Other intervertebral disc degeneration, lumbar region: Secondary | ICD-10-CM | POA: Diagnosis not present

## 2020-06-22 DIAGNOSIS — M5416 Radiculopathy, lumbar region: Secondary | ICD-10-CM | POA: Diagnosis not present

## 2020-06-22 DIAGNOSIS — M25551 Pain in right hip: Secondary | ICD-10-CM | POA: Diagnosis not present

## 2020-06-23 DIAGNOSIS — R29898 Other symptoms and signs involving the musculoskeletal system: Secondary | ICD-10-CM | POA: Diagnosis not present

## 2020-06-23 DIAGNOSIS — G8929 Other chronic pain: Secondary | ICD-10-CM | POA: Diagnosis not present

## 2020-06-23 DIAGNOSIS — M5416 Radiculopathy, lumbar region: Secondary | ICD-10-CM | POA: Diagnosis not present

## 2020-06-23 DIAGNOSIS — S73191A Other sprain of right hip, initial encounter: Secondary | ICD-10-CM | POA: Diagnosis not present

## 2020-06-23 DIAGNOSIS — M419 Scoliosis, unspecified: Secondary | ICD-10-CM | POA: Diagnosis not present

## 2020-06-23 DIAGNOSIS — M25551 Pain in right hip: Secondary | ICD-10-CM | POA: Diagnosis not present

## 2020-06-23 DIAGNOSIS — M25451 Effusion, right hip: Secondary | ICD-10-CM | POA: Diagnosis not present

## 2020-06-23 DIAGNOSIS — M545 Low back pain, unspecified: Secondary | ICD-10-CM | POA: Diagnosis not present

## 2020-06-23 DIAGNOSIS — M1611 Unilateral primary osteoarthritis, right hip: Secondary | ICD-10-CM | POA: Diagnosis not present

## 2020-07-03 DIAGNOSIS — M48061 Spinal stenosis, lumbar region without neurogenic claudication: Secondary | ICD-10-CM | POA: Diagnosis not present

## 2020-07-03 DIAGNOSIS — M5416 Radiculopathy, lumbar region: Secondary | ICD-10-CM | POA: Diagnosis not present

## 2020-07-03 DIAGNOSIS — Z79891 Long term (current) use of opiate analgesic: Secondary | ICD-10-CM | POA: Diagnosis not present

## 2020-07-03 DIAGNOSIS — R030 Elevated blood-pressure reading, without diagnosis of hypertension: Secondary | ICD-10-CM | POA: Diagnosis not present

## 2020-07-04 DIAGNOSIS — Z125 Encounter for screening for malignant neoplasm of prostate: Secondary | ICD-10-CM | POA: Diagnosis not present

## 2020-07-04 DIAGNOSIS — Z13220 Encounter for screening for lipoid disorders: Secondary | ICD-10-CM | POA: Diagnosis not present

## 2020-07-04 DIAGNOSIS — Z6825 Body mass index (BMI) 25.0-25.9, adult: Secondary | ICD-10-CM | POA: Diagnosis not present

## 2020-07-04 DIAGNOSIS — Z79899 Other long term (current) drug therapy: Secondary | ICD-10-CM | POA: Diagnosis not present

## 2020-07-04 DIAGNOSIS — R5383 Other fatigue: Secondary | ICD-10-CM | POA: Diagnosis not present

## 2020-07-04 DIAGNOSIS — I10 Essential (primary) hypertension: Secondary | ICD-10-CM | POA: Diagnosis not present

## 2020-07-04 DIAGNOSIS — M5416 Radiculopathy, lumbar region: Secondary | ICD-10-CM | POA: Diagnosis not present

## 2020-07-10 DIAGNOSIS — M5416 Radiculopathy, lumbar region: Secondary | ICD-10-CM | POA: Diagnosis not present

## 2020-08-15 DIAGNOSIS — M25552 Pain in left hip: Secondary | ICD-10-CM | POA: Diagnosis not present

## 2020-08-15 DIAGNOSIS — M25551 Pain in right hip: Secondary | ICD-10-CM | POA: Diagnosis not present

## 2020-08-23 DIAGNOSIS — M5136 Other intervertebral disc degeneration, lumbar region: Secondary | ICD-10-CM | POA: Diagnosis not present

## 2020-08-23 DIAGNOSIS — M160 Bilateral primary osteoarthritis of hip: Secondary | ICD-10-CM | POA: Diagnosis not present

## 2020-08-25 DIAGNOSIS — M1990 Unspecified osteoarthritis, unspecified site: Secondary | ICD-10-CM | POA: Diagnosis not present

## 2020-08-25 DIAGNOSIS — Z6824 Body mass index (BMI) 24.0-24.9, adult: Secondary | ICD-10-CM | POA: Diagnosis not present

## 2020-08-25 DIAGNOSIS — I10 Essential (primary) hypertension: Secondary | ICD-10-CM | POA: Diagnosis not present

## 2020-09-25 DIAGNOSIS — Z01812 Encounter for preprocedural laboratory examination: Secondary | ICD-10-CM | POA: Diagnosis not present

## 2020-09-25 DIAGNOSIS — R7989 Other specified abnormal findings of blood chemistry: Secondary | ICD-10-CM | POA: Diagnosis not present

## 2020-09-25 DIAGNOSIS — M1611 Unilateral primary osteoarthritis, right hip: Secondary | ICD-10-CM | POA: Diagnosis not present

## 2020-10-04 DIAGNOSIS — Z01818 Encounter for other preprocedural examination: Secondary | ICD-10-CM | POA: Diagnosis not present

## 2020-10-12 DIAGNOSIS — Z20822 Contact with and (suspected) exposure to covid-19: Secondary | ICD-10-CM | POA: Diagnosis not present

## 2020-10-12 DIAGNOSIS — Z96641 Presence of right artificial hip joint: Secondary | ICD-10-CM | POA: Diagnosis not present

## 2020-10-12 DIAGNOSIS — M1611 Unilateral primary osteoarthritis, right hip: Secondary | ICD-10-CM | POA: Diagnosis not present

## 2020-10-12 DIAGNOSIS — M247 Protrusio acetabuli: Secondary | ICD-10-CM | POA: Diagnosis not present

## 2020-10-12 DIAGNOSIS — M160 Bilateral primary osteoarthritis of hip: Secondary | ICD-10-CM | POA: Diagnosis not present

## 2020-10-12 DIAGNOSIS — G8918 Other acute postprocedural pain: Secondary | ICD-10-CM | POA: Diagnosis not present

## 2020-10-12 DIAGNOSIS — Z471 Aftercare following joint replacement surgery: Secondary | ICD-10-CM | POA: Diagnosis not present

## 2020-10-14 DIAGNOSIS — M1610 Unilateral primary osteoarthritis, unspecified hip: Secondary | ICD-10-CM | POA: Diagnosis not present

## 2020-10-14 DIAGNOSIS — R2689 Other abnormalities of gait and mobility: Secondary | ICD-10-CM | POA: Diagnosis not present

## 2020-10-14 DIAGNOSIS — Z4789 Encounter for other orthopedic aftercare: Secondary | ICD-10-CM | POA: Diagnosis not present

## 2020-10-14 DIAGNOSIS — I10 Essential (primary) hypertension: Secondary | ICD-10-CM | POA: Diagnosis not present

## 2020-10-14 DIAGNOSIS — Z471 Aftercare following joint replacement surgery: Secondary | ICD-10-CM | POA: Diagnosis not present

## 2020-10-14 DIAGNOSIS — R279 Unspecified lack of coordination: Secondary | ICD-10-CM | POA: Diagnosis not present

## 2020-10-14 DIAGNOSIS — R41841 Cognitive communication deficit: Secondary | ICD-10-CM | POA: Diagnosis not present

## 2020-10-14 DIAGNOSIS — G478 Other sleep disorders: Secondary | ICD-10-CM | POA: Diagnosis not present

## 2020-10-14 DIAGNOSIS — Z743 Need for continuous supervision: Secondary | ICD-10-CM | POA: Diagnosis not present

## 2020-10-14 DIAGNOSIS — Z96641 Presence of right artificial hip joint: Secondary | ICD-10-CM | POA: Diagnosis not present

## 2020-10-14 DIAGNOSIS — M25551 Pain in right hip: Secondary | ICD-10-CM | POA: Diagnosis not present

## 2020-10-14 DIAGNOSIS — E538 Deficiency of other specified B group vitamins: Secondary | ICD-10-CM | POA: Diagnosis not present

## 2020-10-14 DIAGNOSIS — R350 Frequency of micturition: Secondary | ICD-10-CM | POA: Diagnosis not present

## 2020-10-14 DIAGNOSIS — M6281 Muscle weakness (generalized): Secondary | ICD-10-CM | POA: Diagnosis not present

## 2020-10-14 DIAGNOSIS — K219 Gastro-esophageal reflux disease without esophagitis: Secondary | ICD-10-CM | POA: Diagnosis not present

## 2020-10-16 DIAGNOSIS — K219 Gastro-esophageal reflux disease without esophagitis: Secondary | ICD-10-CM | POA: Diagnosis not present

## 2020-10-16 DIAGNOSIS — G478 Other sleep disorders: Secondary | ICD-10-CM | POA: Diagnosis not present

## 2020-10-16 DIAGNOSIS — I10 Essential (primary) hypertension: Secondary | ICD-10-CM | POA: Diagnosis not present

## 2020-10-16 DIAGNOSIS — Z96641 Presence of right artificial hip joint: Secondary | ICD-10-CM | POA: Diagnosis not present

## 2020-10-24 DIAGNOSIS — Z96641 Presence of right artificial hip joint: Secondary | ICD-10-CM | POA: Diagnosis not present

## 2020-10-24 DIAGNOSIS — G478 Other sleep disorders: Secondary | ICD-10-CM | POA: Diagnosis not present

## 2020-10-24 DIAGNOSIS — I10 Essential (primary) hypertension: Secondary | ICD-10-CM | POA: Diagnosis not present

## 2020-10-24 DIAGNOSIS — K219 Gastro-esophageal reflux disease without esophagitis: Secondary | ICD-10-CM | POA: Diagnosis not present

## 2020-10-25 DIAGNOSIS — Z96641 Presence of right artificial hip joint: Secondary | ICD-10-CM | POA: Diagnosis not present

## 2020-10-25 DIAGNOSIS — K219 Gastro-esophageal reflux disease without esophagitis: Secondary | ICD-10-CM | POA: Diagnosis not present

## 2020-10-25 DIAGNOSIS — Z471 Aftercare following joint replacement surgery: Secondary | ICD-10-CM | POA: Diagnosis not present

## 2020-10-25 DIAGNOSIS — G478 Other sleep disorders: Secondary | ICD-10-CM | POA: Diagnosis not present

## 2020-10-25 DIAGNOSIS — R350 Frequency of micturition: Secondary | ICD-10-CM | POA: Diagnosis not present

## 2020-11-29 DIAGNOSIS — Z471 Aftercare following joint replacement surgery: Secondary | ICD-10-CM | POA: Diagnosis not present

## 2020-11-29 DIAGNOSIS — M25551 Pain in right hip: Secondary | ICD-10-CM | POA: Diagnosis not present

## 2020-11-29 DIAGNOSIS — Z96641 Presence of right artificial hip joint: Secondary | ICD-10-CM | POA: Diagnosis not present

## 2021-01-24 DIAGNOSIS — K219 Gastro-esophageal reflux disease without esophagitis: Secondary | ICD-10-CM | POA: Diagnosis not present

## 2021-01-24 DIAGNOSIS — M1990 Unspecified osteoarthritis, unspecified site: Secondary | ICD-10-CM | POA: Diagnosis not present

## 2021-07-24 DIAGNOSIS — I10 Essential (primary) hypertension: Secondary | ICD-10-CM | POA: Diagnosis not present

## 2021-07-24 DIAGNOSIS — Z125 Encounter for screening for malignant neoplasm of prostate: Secondary | ICD-10-CM | POA: Diagnosis not present

## 2021-07-24 DIAGNOSIS — D510 Vitamin B12 deficiency anemia due to intrinsic factor deficiency: Secondary | ICD-10-CM | POA: Diagnosis not present

## 2021-08-20 DIAGNOSIS — I10 Essential (primary) hypertension: Secondary | ICD-10-CM | POA: Diagnosis not present

## 2021-08-20 DIAGNOSIS — Z79899 Other long term (current) drug therapy: Secondary | ICD-10-CM | POA: Diagnosis not present

## 2021-08-20 DIAGNOSIS — Z0000 Encounter for general adult medical examination without abnormal findings: Secondary | ICD-10-CM | POA: Diagnosis not present

## 2021-08-20 DIAGNOSIS — J302 Other seasonal allergic rhinitis: Secondary | ICD-10-CM | POA: Diagnosis not present

## 2021-08-20 DIAGNOSIS — D510 Vitamin B12 deficiency anemia due to intrinsic factor deficiency: Secondary | ICD-10-CM | POA: Diagnosis not present

## 2021-08-20 DIAGNOSIS — M1990 Unspecified osteoarthritis, unspecified site: Secondary | ICD-10-CM | POA: Diagnosis not present

## 2021-08-20 DIAGNOSIS — Z1331 Encounter for screening for depression: Secondary | ICD-10-CM | POA: Diagnosis not present

## 2021-08-20 DIAGNOSIS — K219 Gastro-esophageal reflux disease without esophagitis: Secondary | ICD-10-CM | POA: Diagnosis not present

## 2021-08-20 DIAGNOSIS — Z6823 Body mass index (BMI) 23.0-23.9, adult: Secondary | ICD-10-CM | POA: Diagnosis not present

## 2021-08-30 DIAGNOSIS — D510 Vitamin B12 deficiency anemia due to intrinsic factor deficiency: Secondary | ICD-10-CM | POA: Diagnosis not present

## 2021-09-03 DIAGNOSIS — D510 Vitamin B12 deficiency anemia due to intrinsic factor deficiency: Secondary | ICD-10-CM | POA: Diagnosis not present

## 2021-09-04 DIAGNOSIS — Z1211 Encounter for screening for malignant neoplasm of colon: Secondary | ICD-10-CM | POA: Diagnosis not present

## 2021-09-04 DIAGNOSIS — Z1212 Encounter for screening for malignant neoplasm of rectum: Secondary | ICD-10-CM | POA: Diagnosis not present

## 2021-09-12 ENCOUNTER — Other Ambulatory Visit: Payer: Self-pay | Admitting: Hematology and Oncology

## 2021-09-12 DIAGNOSIS — D649 Anemia, unspecified: Secondary | ICD-10-CM

## 2021-09-13 ENCOUNTER — Other Ambulatory Visit: Payer: Self-pay | Admitting: Hematology and Oncology

## 2021-09-13 DIAGNOSIS — D649 Anemia, unspecified: Secondary | ICD-10-CM

## 2021-09-14 ENCOUNTER — Inpatient Hospital Stay: Payer: PPO

## 2021-09-14 ENCOUNTER — Inpatient Hospital Stay: Payer: PPO | Attending: Hematology and Oncology | Admitting: Hematology and Oncology

## 2021-09-14 ENCOUNTER — Encounter: Payer: Self-pay | Admitting: Hematology and Oncology

## 2021-09-14 VITALS — BP 163/70 | HR 54 | Temp 98.0°F | Resp 20 | Ht 71.0 in | Wt 170.2 lb

## 2021-09-14 DIAGNOSIS — Z8639 Personal history of other endocrine, nutritional and metabolic disease: Secondary | ICD-10-CM | POA: Diagnosis not present

## 2021-09-14 DIAGNOSIS — R5383 Other fatigue: Secondary | ICD-10-CM | POA: Insufficient documentation

## 2021-09-14 DIAGNOSIS — D649 Anemia, unspecified: Secondary | ICD-10-CM | POA: Insufficient documentation

## 2021-09-14 DIAGNOSIS — I10 Essential (primary) hypertension: Secondary | ICD-10-CM | POA: Diagnosis not present

## 2021-09-14 DIAGNOSIS — R195 Other fecal abnormalities: Secondary | ICD-10-CM | POA: Diagnosis not present

## 2021-09-14 DIAGNOSIS — M25552 Pain in left hip: Secondary | ICD-10-CM | POA: Diagnosis not present

## 2021-09-14 DIAGNOSIS — Z820 Family history of epilepsy and other diseases of the nervous system: Secondary | ICD-10-CM

## 2021-09-14 DIAGNOSIS — E538 Deficiency of other specified B group vitamins: Secondary | ICD-10-CM | POA: Diagnosis not present

## 2021-09-14 DIAGNOSIS — K219 Gastro-esophageal reflux disease without esophagitis: Secondary | ICD-10-CM | POA: Diagnosis not present

## 2021-09-14 DIAGNOSIS — Z79899 Other long term (current) drug therapy: Secondary | ICD-10-CM | POA: Insufficient documentation

## 2021-09-14 LAB — HEPATIC FUNCTION PANEL
ALT: 21 U/L (ref 10–40)
AST: 27 (ref 14–40)
Alkaline Phosphatase: 90 (ref 25–125)
Bilirubin, Total: 0.8

## 2021-09-14 LAB — CBC
MCV: 88 (ref 81–99)
RBC: 4.57 (ref 3.87–5.11)

## 2021-09-14 LAB — BASIC METABOLIC PANEL
BUN: 21 (ref 4–21)
CO2: 27 — AB (ref 13–22)
Chloride: 104 (ref 99–108)
Creatinine: 1.1 (ref 0.6–1.3)
Glucose: 105
Potassium: 4.5 mEq/L (ref 3.5–5.1)
Sodium: 137 (ref 137–147)

## 2021-09-14 LAB — TSH: TSH: 1.176 u[IU]/mL (ref 0.350–4.500)

## 2021-09-14 LAB — CBC AND DIFFERENTIAL
HCT: 40 — AB (ref 41–53)
Hemoglobin: 13.4 — AB (ref 13.5–17.5)
Neutrophils Absolute: 3.95
Platelets: 257 10*3/uL (ref 150–400)
WBC: 7.6

## 2021-09-14 LAB — COMPREHENSIVE METABOLIC PANEL
Albumin: 4.5 (ref 3.5–5.0)
Calcium: 9.2 (ref 8.7–10.7)

## 2021-09-14 LAB — LACTATE DEHYDROGENASE: LDH: 105 U/L (ref 98–192)

## 2021-09-14 NOTE — Progress Notes (Cosign Needed)
[facility]  [address] [place],  [place]  [address] [phone]  Clinic Day:  09/14/2021  Referring physician: Angelina Sheriff, MD   REASON FOR CONSULTATION:  Anemia  HISTORY OF PRESENT ILLNESS:  Keith Hester is a 71 y.o. male with a history of B12 deficiency who reported worsening fatigue, which he attributed to his left hip pain.  He developed mild anemia over the past few months.  He is referred in consultation by Dr. Lovette Cliche for assessment and management.  On July 10, the patient had a hemoglobin of 12.9 with an MCV of 83.  White blood cell count was 7.6 with a normal differential and platelets 242,000.  Prior to this, his hemoglobin was 13 in June and 13.3 in May.  Evaluation in July revealed normal iron, B12 and folate.  Serum protein electrophoresis did not reveal a monoclonal spike.  The patient's last colonoscopy was about 12 years ago, so he underwent Cologuard testing.  Unfortunately, this was positive.  He has been referred to Dr. Lyda Jester for repeat colonoscopy.  That appointment is pending.  The patient denies any overt form of blood loss, including melena, hematochezia, or hematuria.  In addition to above, he has a history of hypertension, GERD and osteoarthritis.  He states his brother had Huntington's disease, but the patient has not had any genetic testing.  REVIEW OF SYSTEMS:  Review of Systems  Constitutional:  Positive for fatigue. Negative for appetite change, chills, fever and unexpected weight change.  HENT:   Negative for lump/mass, mouth sores and sore throat.   Respiratory:  Negative for cough and shortness of breath.   Cardiovascular:  Negative for chest pain and leg swelling.  Gastrointestinal:  Negative for abdominal pain, blood in stool, constipation, diarrhea, nausea, rectal pain and vomiting.  Genitourinary:  Negative for difficulty urinating, dysuria, frequency and hematuria.   Musculoskeletal:   Positive for arthralgias (Left hip). Negative for back pain and myalgias.  Skin:  Negative for itching, rash and wound.  Neurological:  Negative for dizziness, extremity weakness, headaches, light-headedness and numbness.  Hematological:  Negative for adenopathy.  Psychiatric/Behavioral:  Negative for depression and sleep disturbance. The patient is not nervous/anxious.      VITALS:  Blood pressure (!) 163/70, pulse (!) 54, temperature 98 F (36.7 C), temperature source Oral, resp. rate 20, height '5\' 11"'$  (1.803 m), weight 170 lb 3.2 oz (77.2 kg), SpO2 96 %.  Wt Readings from Last 3 Encounters:  09/14/21 170 lb 3.2 oz (77.2 kg)  12/18/11 174 lb 14.4 oz (79.3 kg)  12/13/11 179 lb 1.6 oz (81.2 kg)    Body mass index is 23.74 kg/m.  Performance status (ECOG): 1 - Symptomatic but completely ambulatory  PHYSICAL EXAM:  Physical Exam Vitals and nursing note reviewed.  Constitutional:      General: He is not in acute distress.    Appearance: Normal appearance. He is normal weight.  HENT:     Head: Normocephalic and atraumatic.     Mouth/Throat:     Mouth: Mucous membranes are moist.     Pharynx: Oropharynx is clear. No oropharyngeal exudate or posterior oropharyngeal erythema.  Eyes:     General: No scleral icterus.    Extraocular Movements: Extraocular movements intact.     Conjunctiva/sclera: Conjunctivae normal.     Pupils: Pupils are equal, round, and reactive to light.  Cardiovascular:     Rate and Rhythm: Normal rate and regular rhythm.  Heart sounds: Normal heart sounds. No murmur heard.    No friction rub. No gallop.  Pulmonary:     Effort: Pulmonary effort is normal.     Breath sounds: Normal breath sounds. No wheezing, rhonchi or rales.  Abdominal:     General: Bowel sounds are normal. There is no distension.     Palpations: Abdomen is soft. There is no hepatomegaly, splenomegaly or mass.     Tenderness: There is no abdominal tenderness.  Musculoskeletal:         General: Normal range of motion.     Cervical back: Normal range of motion and neck supple. No tenderness.     Right lower leg: No edema.     Left lower leg: No edema.  Lymphadenopathy:     Cervical: No cervical adenopathy.     Upper Body:     Right upper body: No supraclavicular or axillary adenopathy.     Left upper body: No supraclavicular or axillary adenopathy.     Lower Body: No right inguinal adenopathy. No left inguinal adenopathy.  Skin:    General: Skin is warm and dry.     Coloration: Skin is not jaundiced.     Findings: No rash.  Neurological:     Mental Status: He is alert and oriented to person, place, and time.     Cranial Nerves: No cranial nerve deficit.  Psychiatric:        Mood and Affect: Mood normal.        Behavior: Behavior normal.        Thought Content: Thought content normal.      LABS:      Latest Ref Rng & Units 09/14/2021   12:00 AM 12/13/2011    3:05 PM  CBC  WBC  7.6     11.7   Hemoglobin 13.5 - 17.5 13.4     13.5   Hematocrit 41 - 53 40     38.4   Platelets 150 - 400 K/uL 257     243      This result is from an external source.      Latest Ref Rng & Units 09/14/2021   12:00 AM 12/13/2011    3:05 PM  CMP  Glucose 70 - 99 mg/dL  94   BUN 4 - '21 21     14   '$ Creatinine 0.6 - 1.3 1.1     1.00   Sodium 137 - 147 137     133   Potassium 3.5 - 5.1 mEq/L 4.5     4.7   Chloride 99 - 108 104     99   CO2 13 - '22 27     25   '$ Calcium 8.7 - 10.7 9.2     9.2   Alkaline Phos 25 - 125 90       AST 14 - 40 27       ALT 10 - 40 U/L 21          This result is from an external source.     No results found for: "CEA1", "CEA" / No results found for: "CEA1", "CEA" No results found for: "PSA1" No results found for: "XHB716" No results found for: "CAN125"  No results found for: "TOTALPROTELP", "ALBUMINELP", "A1GS", "A2GS", "BETS", "BETA2SER", "GAMS", "MSPIKE", "SPEI" No results found for: "TIBC", "FERRITIN", "IRONPCTSAT" No results found for:  "LDH"  STUDIES:  No results found.    HISTORY:   Past Medical History:  Diagnosis  Date   Anemia    Arthritis    GERD (gastroesophageal reflux disease)    Hypertension     Past Surgical History:  Procedure Laterality Date   ANTERIOR CERVICAL DECOMP/DISCECTOMY FUSION  12/18/2011   Procedure: ANTERIOR CERVICAL DECOMPRESSION/DISCECTOMY FUSION 1 LEVEL;  Surgeon: Ophelia Charter, MD;  Location: [facility];  Service: Neurosurgery;  Laterality: N/A;  Cervical six-seven anterior cervical decompression and fusion with interbody prothesis plating and bonegraft   Colonscopy     NO PAST SURGERIES     THA Right    right total hip replament    Family History  Problem Relation Age of Onset   Hypertension Mother    Hypertension Father    CAD Father    Rheum arthritis Sister    Huntington's disease Brother     Social History:  reports that he has never smoked. He has never used smokeless tobacco. He reports that he does not drink alcohol and does not use drugs.The patient was born and raised in [place].  He is married with 1 daughter.  He is retired from being a Academic librarian.  He is accompanied by his wife today.  Allergies: No Known Allergies  Current Medications: Current Outpatient Medications  Medication Sig Dispense Refill   lisinopril (ZESTRIL) 10 MG tablet Take by mouth.     docusate sodium 100 MG CAPS Take 100 mg by mouth 2 (two) times daily. 60 capsule 1   famotidine (PEPCID) 10 MG tablet Take by mouth.     loratadine (CLARITIN) 10 MG tablet Take by mouth.     vitamin B-12 (CYANOCOBALAMIN) 1000 MCG tablet Take 1 tablet by mouth daily. 2 tabs     No current facility-administered medications for this visit.     ASSESSMENT & PLAN:   Assessment/Plan:  Keith Hester is a 71 y.o. male with anemia of uncertain etiology.  This may represent anemia of chronic disease.  He does not have an obvious bone marrow malignancy at this time.  Evaluation did not  reveal any nutritional deficiency or monoclonal gammopathy contributing to his anemia.  Given the recent positive Cologuard test, this may be due to GI bleeding and he is not yet iron deficient.  As he is referred to Dr. Lyda Jester, I did not have him do stool Hemoccult cards.  I will add a TSH and LDH for further evaluation.  In regards to his family history of Huntington's disease, we could refer him to genetic counselor at a tertiary center for counseling and testing.  I discussed the assessment and plan with the patient and his wife.  They were provided an opportunity to ask questions and all were answered.  The patient agreed with the plan and demonstrated an understanding of the instructions.  He knows to contact our office if he develops symptoms of worsening anemia.  Thank you for the referral.   I provided 30 minutes of face-to-face time during this encounter and > 50% was spent counseling as documented under my assessment and plan.    Marvia Pickles, PA-C

## 2021-09-25 DIAGNOSIS — R634 Abnormal weight loss: Secondary | ICD-10-CM | POA: Diagnosis not present

## 2021-09-25 DIAGNOSIS — K5900 Constipation, unspecified: Secondary | ICD-10-CM | POA: Diagnosis not present

## 2021-09-25 DIAGNOSIS — R195 Other fecal abnormalities: Secondary | ICD-10-CM | POA: Diagnosis not present

## 2021-10-12 ENCOUNTER — Other Ambulatory Visit: Payer: Self-pay | Admitting: *Deleted

## 2021-10-12 NOTE — Patient Outreach (Signed)
  Care Coordination   10/12/2021 Name: Keith Hester MRN: [id number] DOB: 1950-06-13   Care Coordination Outreach Attempts: Contact was made with the patient today to offer care coordination services as a benefit of their health plan. Patient declined services. Follow Up Plan:  No further outreach attempts will be made at this time.  Encounter Outcome:  Pt. Refused  Care Coordination Interventions Activated:  No   Care Coordination Interventions:  No, not indicated    Emelia Loron RN, BSN [place] [phone] [email or web address]

## 2021-10-18 DIAGNOSIS — K449 Diaphragmatic hernia without obstruction or gangrene: Secondary | ICD-10-CM | POA: Diagnosis not present

## 2021-10-18 DIAGNOSIS — K222 Esophageal obstruction: Secondary | ICD-10-CM | POA: Diagnosis not present

## 2021-10-18 DIAGNOSIS — K2210 Ulcer of esophagus without bleeding: Secondary | ICD-10-CM | POA: Diagnosis not present

## 2021-10-18 DIAGNOSIS — R634 Abnormal weight loss: Secondary | ICD-10-CM | POA: Diagnosis not present

## 2021-10-18 DIAGNOSIS — I10 Essential (primary) hypertension: Secondary | ICD-10-CM | POA: Diagnosis not present

## 2021-10-18 DIAGNOSIS — K648 Other hemorrhoids: Secondary | ICD-10-CM | POA: Diagnosis not present

## 2021-10-18 DIAGNOSIS — K2100 Gastro-esophageal reflux disease with esophagitis, without bleeding: Secondary | ICD-10-CM | POA: Diagnosis not present

## 2021-10-18 DIAGNOSIS — K219 Gastro-esophageal reflux disease without esophagitis: Secondary | ICD-10-CM | POA: Diagnosis not present

## 2021-10-18 DIAGNOSIS — R195 Other fecal abnormalities: Secondary | ICD-10-CM | POA: Diagnosis not present

## 2021-10-18 DIAGNOSIS — K644 Residual hemorrhoidal skin tags: Secondary | ICD-10-CM | POA: Diagnosis not present

## 2021-12-04 DIAGNOSIS — K219 Gastro-esophageal reflux disease without esophagitis: Secondary | ICD-10-CM | POA: Diagnosis not present

## 2021-12-18 ENCOUNTER — Other Ambulatory Visit: Payer: Commercial Managed Care - PPO

## 2021-12-18 ENCOUNTER — Ambulatory Visit: Payer: Commercial Managed Care - PPO | Admitting: Hematology and Oncology

## 2021-12-18 DIAGNOSIS — Z79899 Other long term (current) drug therapy: Secondary | ICD-10-CM | POA: Diagnosis not present

## 2021-12-25 ENCOUNTER — Inpatient Hospital Stay (INDEPENDENT_AMBULATORY_CARE_PROVIDER_SITE_OTHER): Payer: PPO | Admitting: Hematology and Oncology

## 2021-12-25 ENCOUNTER — Encounter: Payer: Self-pay | Admitting: Hematology and Oncology

## 2021-12-25 ENCOUNTER — Inpatient Hospital Stay: Payer: PPO | Attending: Hematology and Oncology

## 2021-12-25 ENCOUNTER — Telehealth: Payer: Self-pay | Admitting: Hematology and Oncology

## 2021-12-25 ENCOUNTER — Other Ambulatory Visit: Payer: Self-pay | Admitting: Hematology and Oncology

## 2021-12-25 VITALS — BP 167/79 | HR 57 | Temp 97.6°F | Resp 20 | Ht 71.0 in | Wt 173.0 lb

## 2021-12-25 DIAGNOSIS — D510 Vitamin B12 deficiency anemia due to intrinsic factor deficiency: Secondary | ICD-10-CM | POA: Diagnosis not present

## 2021-12-25 DIAGNOSIS — D649 Anemia, unspecified: Secondary | ICD-10-CM | POA: Diagnosis not present

## 2021-12-25 LAB — CBC AND DIFFERENTIAL
HCT: 40 — AB (ref 41–53)
Hemoglobin: 13.4 — AB (ref 13.5–17.5)
MCV: 86 (ref 80–94)
Neutrophils Absolute: 3.54
Platelets: 218 10*3/uL (ref 150–400)
WBC: 6.8

## 2021-12-25 LAB — CBC: RBC: 4.59 (ref 3.87–5.11)

## 2021-12-25 NOTE — Telephone Encounter (Signed)
12/25/21 Next appt scheduled and confirmed with patient

## 2021-12-25 NOTE — Progress Notes (Cosign Needed)
Keith Hester  [address] [place],  [place]  [address] [phone]  Clinic Day:  12/25/2021  Referring physician: Angelina Sheriff, MD   HISTORY OF PRESENT ILLNESS:  The patient is a 71 y.o. male with mild anemia of uncertain etiology.  He is here for repeat clinical assessment and denies progressive fatigue for worsening anemia.  He was not found to be iron deficient, but had a positive Cologuard test so underwent EGD and colonoscopy in August.  EGD revealed a large hiatal hernia, erosive esophagitis and a distal esophageal stricture.  The stricture was dilated.  Famotidine was discontinued and he was placed on omeprazole 40 mg daily.  Esophageal and small intestine biopsies revealed benign changes.  Colonoscopy revealed internal and small external hemorrhoids, no other abnormalities.  The patient states he has felt much better since starting on omeprazole.  He did not even realize he was having as much acid reflux.  In regards to his left hip pain.  He has seen the orthopedic surgeon and left hip replacement is being planned.  He denies any neurologic symptoms.  REVIEW OF SYSTEMS:  Review of Systems  Constitutional:  Negative for appetite change, chills, fatigue, fever and unexpected weight change.  HENT:   Negative for lump/mass, mouth sores and sore throat.   Respiratory:  Negative for cough and shortness of breath.   Cardiovascular:  Negative for chest pain and leg swelling.  Gastrointestinal:  Negative for abdominal pain, constipation, diarrhea, nausea and vomiting.  Genitourinary:  Negative for difficulty urinating, dysuria, frequency and hematuria.   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 Negative for back pain and myalgias.  Skin:  Negative for itching, rash and wound.  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 Negative for dizziness, extremity weakness,  headaches, light-headedness, numbness and speech difficulty.  Hematological:  Negative for adenopathy.  Psychiatric/Behavioral:  Negative for depression and sleep disturbance. The patient is not nervous/anxious.     PHYSICAL EXAM:  Blood pressure (!) 167/79, pulse (!) 57, temperature 97.6 F (36.4 C), temperature source Oral, resp. rate 20, height '5\' 11"'$  (1.803 m), weight 173 lb (78.5 kg), SpO2 99 %. Wt Readings from Last 3 Encounters:  12/25/21 173 lb (78.5 kg)  09/14/21 170 lb 3.2 oz (77.2 kg)  12/18/11 174 lb 14.4 oz (79.3 kg)   Body mass index is 24.13 kg/m.  Performance status (ECOG): 1 - Symptomatic but completely ambulatory  Physical Exam Vitals and nursing note reviewed.  Constitutional:      General: He is not in acute distress.    Appearance: Normal appearance. He is normal weight.  HENT:     Head: Normocephalic and atraumatic.     Mouth/Throat:     Mouth: Mucous membranes are moist.     Pharynx: Oropharynx is clear. No oropharyngeal exudate or posterior oropharyngeal erythema.  Eyes:     General: No scleral icterus.    Extraocular Movements: Extraocular movements intact.     Conjunctiva/sclera: Conjunctivae normal.     Pupils: Pupils are equal, round, and reactive to light.  Cardiovascular:     Rate and Rhythm: Normal rate and regular rhythm.     Heart sounds: Normal heart sounds. No murmur heard.    No friction rub. No gallop.  Pulmonary:     Effort: Pulmonary effort is normal.     Breath sounds: Normal breath sounds. No wheezing, rhonchi or rales.  Abdominal:  General: Bowel sounds are normal. There is no distension.     Palpations: Abdomen is soft. There is no hepatomegaly, splenomegaly or mass.     Tenderness: There is no abdominal tenderness.  Musculoskeletal:        General: Normal range of motion.     Cervical back: Normal range of motion and neck supple. No tenderness.     Right lower leg: No edema.     Left lower leg: No edema.  Lymphadenopathy:      Cervical: No cervical adenopathy.  Skin:    General: Skin is warm and dry.     Coloration: Skin is not jaundiced.     Findings: No rash.  Neurological:     Mental Status: He is alert and oriented to person, place, and time.     Cranial Nerves: No cranial nerve deficit.  Psychiatric:        Mood and Affect: Mood normal.        Behavior: Behavior normal.        Thought Content: Thought content normal.     LABS:      Latest Ref Rng & Units 12/25/2021   12:00 AM 09/14/2021   12:00 AM 12/13/2011    3:05 PM  CBC  WBC  6.8     7.6     11.7   Hemoglobin 13.5 - 17.5 13.4     13.4     13.5   Hematocrit 41 - 53 40     40     38.4   Platelets 150 - 400 K/uL 218     257     243      This result is from an external source.      Latest Ref Rng & Units 09/14/2021   12:00 AM 12/13/2011    3:05 PM  CMP  Glucose 70 - 99 mg/dL  94   BUN 4 - '21 21     14   '$ Creatinine 0.6 - 1.3 1.1     1.00   Sodium 137 - 147 137     133   Potassium 3.5 - 5.1 mEq/L 4.5     4.7   Chloride 99 - 108 104     99   CO2 13 - '22 27     25   '$ Calcium 8.7 - 10.7 9.2     9.2   Alkaline Phos 25 - 125 90       AST 14 - 40 27       ALT 10 - 40 U/L 21          This result is from an external source.     No results found for: "CEA1", "CEA" / No results found for: "CEA1", "CEA" No results found for: "PSA1" No results found for: "YFV494" No results found for: "CAN125"  No results found for: "TOTALPROTELP", "ALBUMINELP", "A1GS", "A2GS", "BETS", "BETA2SER", "GAMS", "MSPIKE", "SPEI" No results found for: "TIBC", "FERRITIN", "IRONPCTSAT" Lab Results  Component Value Date   LDH 105 09/14/2021       Component Value Date/Time   LDH 105 09/14/2021 0910    Review Flowsheet       Latest Ref Rng & Units 09/14/2021  Oncology Labs  LDH 98 - 192 U/L 105      STUDIES:  No results found.    ASSESSMENT & PLAN:   Assessment/Plan:  71 y.o. male with anemia of uncertain etiology.  This could represent anemia  of chronic disease  or perhaps early myelodysplasia, so I recommend continued follow-up.  I offered to send him to see the genetic counselor at [facility] regarding his family history of Huntington's disease, but he declines.  I will see the patient back in 3 months for repeat clinical assessment.  The patient and his wife understand all the plans discussed today and are in agreement with them.  They knows to contact our office if he develops numbness of worsening anemia prior to his next appointment.    Marvia Pickles, PA-C

## 2022-03-26 NOTE — Progress Notes (Unsigned)
Keith Hester  [address] [place],    [address] [phone]  Clinic Day:  03/27/2022  Referring physician: Angelina Sheriff, MD   HISTORY OF PRESENT ILLNESS:  The patient is a 72 y.o. male with mild anemia of uncertain etiology.  He is here for repeat clinical assessment and denies progressive fatigue for worsening anemia.  He denies any overt form of blood loss.  He reports chronic left hip pain. He needs to see his orthopedic surgeon regarding hip replacement.  He denies any neurologic symptoms.  He previously declined referral to genetic counseling regarding his family history of Huntington's disease.  PHYSICAL EXAM:  Blood pressure (!) 145/76, pulse (!) 56, temperature 98.2 F (36.8 C), temperature source Oral, resp. rate 20, height '5\' 11"'$  (1.803 m), weight 174 lb 8 oz (79.2 kg), SpO2 100 %. Wt Readings from Last 3 Encounters:  03/27/22 174 lb 8 oz (79.2 kg)  12/25/21 173 lb (78.5 kg)  09/14/21 170 lb 3.2 oz (77.2 kg)   Body mass index is 24.34 kg/m.  Performance status (ECOG): 1 - Symptomatic but completely ambulatory  Physical Exam Vitals and nursing note reviewed.  Constitutional:      General: He is not in acute distress.    Appearance: Normal appearance. He is normal weight.  HENT:     Head: Normocephalic and atraumatic.     Mouth/Throat:     Mouth: Mucous membranes are moist.     Pharynx: Oropharynx is clear. No oropharyngeal exudate or posterior oropharyngeal erythema.  Eyes:     General: No scleral icterus.    Extraocular Movements: Extraocular movements intact.     Conjunctiva/sclera: Conjunctivae normal.     Pupils: Pupils are equal, round, and reactive to light.  Cardiovascular:     Rate and Rhythm: Normal rate and regular rhythm.     Heart sounds: Normal heart sounds. No murmur heard.    No friction rub. No gallop.  Pulmonary:     Effort: Pulmonary effort is normal.     Breath sounds: Normal breath sounds. No  wheezing, rhonchi or rales.  Abdominal:     General: Bowel sounds are normal. There is no distension.     Palpations: Abdomen is soft. There is no hepatomegaly, splenomegaly or mass.     Tenderness: There is no abdominal tenderness.  Musculoskeletal:        General: Normal range of motion.     Cervical back: Normal range of motion and neck supple. No tenderness.     Right lower leg: No edema.     Left lower leg: No edema.  Lymphadenopathy:     Cervical: No cervical adenopathy.     Upper Body:     Right upper body: No supraclavicular or axillary adenopathy.     Left upper body: No supraclavicular or axillary adenopathy.     Lower Body: No right inguinal adenopathy. No left inguinal adenopathy.  Skin:    General: Skin is warm and dry.     Coloration: Skin is not jaundiced.     Findings: No rash.  Neurological:     Mental Status: He is alert and oriented to person, place, and time.     Cranial Nerves: No cranial nerve deficit.  Psychiatric:        Mood and Affect: Mood normal.        Behavior: Behavior normal.        Thought Content: Thought content normal.  LABS:      Latest Ref Rng & Units 03/27/2022    8:41 AM 12/25/2021   12:00 AM 09/14/2021   12:00 AM  CBC  WBC 4.0 - 10.5 K/uL 7.3  6.8     7.6      Hemoglobin 13.0 - 17.0 g/dL 13.9  13.4     13.4      Hematocrit 39.0 - 52.0 % 40.6  40     40      Platelets 150 - 400 K/uL 229  218     257         This result is from an external source.      Latest Ref Rng & Units 09/14/2021   12:00 AM 12/13/2011    3:05 PM  CMP  Glucose 70 - 99 mg/dL  94   BUN 4 - '21 21     14   '$ Creatinine 0.6 - 1.3 1.1     1.00   Sodium 137 - 147 137     133   Potassium 3.5 - 5.1 mEq/L 4.5     4.7   Chloride 99 - 108 104     99   CO2 13 - '22 27     25   '$ Calcium 8.7 - 10.7 9.2     9.2   Alkaline Phos 25 - 125 90       AST 14 - 40 27       ALT 10 - 40 U/L 21          This result is from an external source.     No results found for:  "CEA1", "CEA" / No results found for: "CEA1", "CEA" No results found for: "PSA1" No results found for: "GEX528" No results found for: "CAN125"  No results found for: "TOTALPROTELP", "ALBUMINELP", "A1GS", "A2GS", "BETS", "BETA2SER", "GAMS", "MSPIKE", "SPEI" No results found for: "TIBC", "FERRITIN", "IRONPCTSAT" Lab Results  Component Value Date   LDH 105 09/14/2021       Component Value Date/Time   LDH 105 09/14/2021 0910    Review Flowsheet       Latest Ref Rng & Units 09/14/2021  Oncology Labs  LDH 98 - 192 U/L 105     STUDIES:  No results found.    ASSESSMENT & PLAN:   Assessment/Plan:  72 y.o. male with anemia of uncertain etiology.  This could represent anemia of chronic disease or perhaps early myelodysplasia, so I planned continued follow-up.  His hemoglobin is normal today.  The patient would like to follow up with Dr. Lin Landsman and I am fine with that.  I recommend repeat CBC once or twice a year.   I would be glad to see him back in the future as needed.  The patient and his wife understand all the plans discussed today and are in agreement with them.   Marvia Pickles, PA-C

## 2022-03-27 ENCOUNTER — Inpatient Hospital Stay: Payer: PPO

## 2022-03-27 ENCOUNTER — Telehealth: Payer: Self-pay

## 2022-03-27 ENCOUNTER — Inpatient Hospital Stay: Payer: PPO | Attending: Hematology and Oncology | Admitting: Hematology and Oncology

## 2022-03-27 ENCOUNTER — Encounter: Payer: Self-pay | Admitting: Hematology and Oncology

## 2022-03-27 VITALS — BP 145/76 | HR 56 | Temp 98.2°F | Resp 20 | Ht 71.0 in | Wt 174.5 lb

## 2022-03-27 DIAGNOSIS — G8929 Other chronic pain: Secondary | ICD-10-CM | POA: Diagnosis not present

## 2022-03-27 DIAGNOSIS — M25552 Pain in left hip: Secondary | ICD-10-CM | POA: Insufficient documentation

## 2022-03-27 DIAGNOSIS — D649 Anemia, unspecified: Secondary | ICD-10-CM | POA: Diagnosis not present

## 2022-03-27 LAB — CBC WITH DIFFERENTIAL (CANCER CENTER ONLY)
Abs Immature Granulocytes: 0.01 10*3/uL (ref 0.00–0.07)
Basophils Absolute: 0.1 10*3/uL (ref 0.0–0.1)
Basophils Relative: 1 %
Eosinophils Absolute: 0.4 10*3/uL (ref 0.0–0.5)
Eosinophils Relative: 6 %
HCT: 40.6 % (ref 39.0–52.0)
Hemoglobin: 13.9 g/dL (ref 13.0–17.0)
Immature Granulocytes: 0 %
Lymphocytes Relative: 32 %
Lymphs Abs: 2.3 10*3/uL (ref 0.7–4.0)
MCH: 29.5 pg (ref 26.0–34.0)
MCHC: 34.2 g/dL (ref 30.0–36.0)
MCV: 86.2 fL (ref 80.0–100.0)
Monocytes Absolute: 0.6 10*3/uL (ref 0.1–1.0)
Monocytes Relative: 8 %
Neutro Abs: 3.9 10*3/uL (ref 1.7–7.7)
Neutrophils Relative %: 53 %
Platelet Count: 229 10*3/uL (ref 150–400)
RBC: 4.71 MIL/uL (ref 4.22–5.81)
RDW: 14.3 % (ref 11.5–15.5)
WBC Count: 7.3 10*3/uL (ref 4.0–10.5)
nRBC: 0 % (ref 0.0–0.2)

## 2022-03-27 NOTE — Telephone Encounter (Signed)
-----  Message from Marvia Pickles, PA-C sent at 03/27/2022 12:34 PM EST ----- Please let him know his hemoglobin is normal. He requested he f/u with Dr. Lin Landsman and that is fine. I am glad to see him back in the future as needed. Thanks

## 2022-03-27 NOTE — Telephone Encounter (Signed)
Patient notified and voiced understanding.

## 2022-05-08 DIAGNOSIS — S0990XA Unspecified injury of head, initial encounter: Secondary | ICD-10-CM | POA: Diagnosis not present

## 2022-10-10 DIAGNOSIS — I878 Other specified disorders of veins: Secondary | ICD-10-CM | POA: Diagnosis not present

## 2022-10-10 DIAGNOSIS — J310 Chronic rhinitis: Secondary | ICD-10-CM | POA: Diagnosis not present

## 2022-10-10 DIAGNOSIS — M159 Polyosteoarthritis, unspecified: Secondary | ICD-10-CM | POA: Diagnosis not present

## 2022-10-10 DIAGNOSIS — K449 Diaphragmatic hernia without obstruction or gangrene: Secondary | ICD-10-CM | POA: Diagnosis not present

## 2022-10-10 DIAGNOSIS — I10 Essential (primary) hypertension: Secondary | ICD-10-CM | POA: Diagnosis not present

## 2022-10-10 DIAGNOSIS — E538 Deficiency of other specified B group vitamins: Secondary | ICD-10-CM | POA: Diagnosis not present

## 2022-10-10 DIAGNOSIS — R7303 Prediabetes: Secondary | ICD-10-CM | POA: Diagnosis not present

## 2022-10-10 DIAGNOSIS — K219 Gastro-esophageal reflux disease without esophagitis: Secondary | ICD-10-CM | POA: Diagnosis not present

## 2022-10-10 DIAGNOSIS — Z862 Personal history of diseases of the blood and blood-forming organs and certain disorders involving the immune mechanism: Secondary | ICD-10-CM | POA: Diagnosis not present

## 2022-10-10 DIAGNOSIS — M5136 Other intervertebral disc degeneration, lumbar region: Secondary | ICD-10-CM | POA: Diagnosis not present

## 2022-10-30 DIAGNOSIS — M1610 Unilateral primary osteoarthritis, unspecified hip: Secondary | ICD-10-CM | POA: Diagnosis not present

## 2022-10-30 DIAGNOSIS — G8929 Other chronic pain: Secondary | ICD-10-CM | POA: Diagnosis not present

## 2022-10-30 DIAGNOSIS — M25552 Pain in left hip: Secondary | ICD-10-CM | POA: Diagnosis not present

## 2022-11-04 DIAGNOSIS — M1610 Unilateral primary osteoarthritis, unspecified hip: Secondary | ICD-10-CM | POA: Diagnosis not present

## 2022-11-14 DIAGNOSIS — R001 Bradycardia, unspecified: Secondary | ICD-10-CM | POA: Diagnosis not present

## 2022-11-14 DIAGNOSIS — Z01818 Encounter for other preprocedural examination: Secondary | ICD-10-CM | POA: Diagnosis not present

## 2022-11-14 DIAGNOSIS — M1612 Unilateral primary osteoarthritis, left hip: Secondary | ICD-10-CM | POA: Diagnosis not present

## 2022-12-05 DIAGNOSIS — Z471 Aftercare following joint replacement surgery: Secondary | ICD-10-CM | POA: Diagnosis not present

## 2022-12-05 DIAGNOSIS — K219 Gastro-esophageal reflux disease without esophagitis: Secondary | ICD-10-CM | POA: Diagnosis not present

## 2022-12-05 DIAGNOSIS — M25552 Pain in left hip: Secondary | ICD-10-CM | POA: Diagnosis not present

## 2022-12-05 DIAGNOSIS — Z791 Long term (current) use of non-steroidal anti-inflammatories (NSAID): Secondary | ICD-10-CM | POA: Diagnosis not present

## 2022-12-05 DIAGNOSIS — M1612 Unilateral primary osteoarthritis, left hip: Secondary | ICD-10-CM | POA: Diagnosis not present

## 2022-12-05 DIAGNOSIS — I890 Lymphedema, not elsewhere classified: Secondary | ICD-10-CM | POA: Diagnosis not present

## 2022-12-05 DIAGNOSIS — Z862 Personal history of diseases of the blood and blood-forming organs and certain disorders involving the immune mechanism: Secondary | ICD-10-CM | POA: Diagnosis not present

## 2022-12-05 DIAGNOSIS — Z96642 Presence of left artificial hip joint: Secondary | ICD-10-CM | POA: Diagnosis not present

## 2022-12-05 DIAGNOSIS — I10 Essential (primary) hypertension: Secondary | ICD-10-CM | POA: Diagnosis not present

## 2022-12-05 DIAGNOSIS — Z9989 Dependence on other enabling machines and devices: Secondary | ICD-10-CM | POA: Diagnosis not present

## 2022-12-05 DIAGNOSIS — Z9181 History of falling: Secondary | ICD-10-CM | POA: Diagnosis not present

## 2022-12-05 DIAGNOSIS — Z79899 Other long term (current) drug therapy: Secondary | ICD-10-CM | POA: Diagnosis not present

## 2022-12-05 DIAGNOSIS — Z96643 Presence of artificial hip joint, bilateral: Secondary | ICD-10-CM | POA: Diagnosis not present

## 2022-12-05 DIAGNOSIS — G8918 Other acute postprocedural pain: Secondary | ICD-10-CM | POA: Diagnosis not present

## 2022-12-09 DIAGNOSIS — H9193 Unspecified hearing loss, bilateral: Secondary | ICD-10-CM | POA: Diagnosis not present

## 2022-12-09 DIAGNOSIS — K219 Gastro-esophageal reflux disease without esophagitis: Secondary | ICD-10-CM | POA: Diagnosis not present

## 2022-12-09 DIAGNOSIS — Z7982 Long term (current) use of aspirin: Secondary | ICD-10-CM | POA: Diagnosis not present

## 2022-12-09 DIAGNOSIS — Z471 Aftercare following joint replacement surgery: Secondary | ICD-10-CM | POA: Diagnosis not present

## 2022-12-09 DIAGNOSIS — Z8616 Personal history of COVID-19: Secondary | ICD-10-CM | POA: Diagnosis not present

## 2022-12-09 DIAGNOSIS — M1990 Unspecified osteoarthritis, unspecified site: Secondary | ICD-10-CM | POA: Diagnosis not present

## 2022-12-09 DIAGNOSIS — D649 Anemia, unspecified: Secondary | ICD-10-CM | POA: Diagnosis not present

## 2022-12-09 DIAGNOSIS — Z9181 History of falling: Secondary | ICD-10-CM | POA: Diagnosis not present

## 2022-12-09 DIAGNOSIS — Z96642 Presence of left artificial hip joint: Secondary | ICD-10-CM | POA: Diagnosis not present

## 2022-12-09 DIAGNOSIS — I10 Essential (primary) hypertension: Secondary | ICD-10-CM | POA: Diagnosis not present

## 2022-12-09 DIAGNOSIS — Z96641 Presence of right artificial hip joint: Secondary | ICD-10-CM | POA: Diagnosis not present

## 2022-12-23 DIAGNOSIS — Z96642 Presence of left artificial hip joint: Secondary | ICD-10-CM | POA: Diagnosis not present

## 2022-12-23 DIAGNOSIS — M25552 Pain in left hip: Secondary | ICD-10-CM | POA: Diagnosis not present

## 2022-12-23 DIAGNOSIS — R269 Unspecified abnormalities of gait and mobility: Secondary | ICD-10-CM | POA: Diagnosis not present

## 2022-12-23 DIAGNOSIS — Z471 Aftercare following joint replacement surgery: Secondary | ICD-10-CM | POA: Diagnosis not present

## 2022-12-27 DIAGNOSIS — Z471 Aftercare following joint replacement surgery: Secondary | ICD-10-CM | POA: Diagnosis not present

## 2022-12-27 DIAGNOSIS — R269 Unspecified abnormalities of gait and mobility: Secondary | ICD-10-CM | POA: Diagnosis not present

## 2022-12-27 DIAGNOSIS — M25552 Pain in left hip: Secondary | ICD-10-CM | POA: Diagnosis not present

## 2022-12-27 DIAGNOSIS — Z96642 Presence of left artificial hip joint: Secondary | ICD-10-CM | POA: Diagnosis not present

## 2022-12-30 DIAGNOSIS — Z471 Aftercare following joint replacement surgery: Secondary | ICD-10-CM | POA: Diagnosis not present

## 2022-12-30 DIAGNOSIS — R269 Unspecified abnormalities of gait and mobility: Secondary | ICD-10-CM | POA: Diagnosis not present

## 2022-12-30 DIAGNOSIS — Z96642 Presence of left artificial hip joint: Secondary | ICD-10-CM | POA: Diagnosis not present

## 2022-12-30 DIAGNOSIS — M25552 Pain in left hip: Secondary | ICD-10-CM | POA: Diagnosis not present

## 2023-01-03 DIAGNOSIS — R269 Unspecified abnormalities of gait and mobility: Secondary | ICD-10-CM | POA: Diagnosis not present

## 2023-01-03 DIAGNOSIS — M25552 Pain in left hip: Secondary | ICD-10-CM | POA: Diagnosis not present

## 2023-01-03 DIAGNOSIS — Z96642 Presence of left artificial hip joint: Secondary | ICD-10-CM | POA: Diagnosis not present

## 2023-01-03 DIAGNOSIS — Z471 Aftercare following joint replacement surgery: Secondary | ICD-10-CM | POA: Diagnosis not present

## 2023-01-06 DIAGNOSIS — M25552 Pain in left hip: Secondary | ICD-10-CM | POA: Diagnosis not present

## 2023-01-06 DIAGNOSIS — Z471 Aftercare following joint replacement surgery: Secondary | ICD-10-CM | POA: Diagnosis not present

## 2023-01-06 DIAGNOSIS — Z96642 Presence of left artificial hip joint: Secondary | ICD-10-CM | POA: Diagnosis not present

## 2023-01-06 DIAGNOSIS — R269 Unspecified abnormalities of gait and mobility: Secondary | ICD-10-CM | POA: Diagnosis not present

## 2023-01-09 DIAGNOSIS — M25552 Pain in left hip: Secondary | ICD-10-CM | POA: Diagnosis not present

## 2023-01-09 DIAGNOSIS — R269 Unspecified abnormalities of gait and mobility: Secondary | ICD-10-CM | POA: Diagnosis not present

## 2023-01-09 DIAGNOSIS — Z471 Aftercare following joint replacement surgery: Secondary | ICD-10-CM | POA: Diagnosis not present

## 2023-01-09 DIAGNOSIS — Z96642 Presence of left artificial hip joint: Secondary | ICD-10-CM | POA: Diagnosis not present

## 2023-01-13 DIAGNOSIS — R269 Unspecified abnormalities of gait and mobility: Secondary | ICD-10-CM | POA: Diagnosis not present

## 2023-01-13 DIAGNOSIS — Z471 Aftercare following joint replacement surgery: Secondary | ICD-10-CM | POA: Diagnosis not present

## 2023-01-13 DIAGNOSIS — Z96642 Presence of left artificial hip joint: Secondary | ICD-10-CM | POA: Diagnosis not present

## 2023-01-13 DIAGNOSIS — M25552 Pain in left hip: Secondary | ICD-10-CM | POA: Diagnosis not present

## 2023-01-17 DIAGNOSIS — R269 Unspecified abnormalities of gait and mobility: Secondary | ICD-10-CM | POA: Diagnosis not present

## 2023-01-17 DIAGNOSIS — Z96642 Presence of left artificial hip joint: Secondary | ICD-10-CM | POA: Diagnosis not present

## 2023-01-17 DIAGNOSIS — Z471 Aftercare following joint replacement surgery: Secondary | ICD-10-CM | POA: Diagnosis not present

## 2023-01-17 DIAGNOSIS — M25552 Pain in left hip: Secondary | ICD-10-CM | POA: Diagnosis not present
